# Patient Record
Sex: Female | Born: 1970 | Race: Black or African American | Hispanic: No | Marital: Married | Smoking: Never smoker
Health system: Southern US, Community
[De-identification: ages and names within clinical notes are randomized; demographics above are authoritative.]

## PROBLEM LIST (undated history)

## (undated) ENCOUNTER — Other Ambulatory Visit: Payer: Self-pay | Admitting: General Surgery

## (undated) DIAGNOSIS — J45909 Unspecified asthma, uncomplicated: Secondary | ICD-10-CM

## (undated) DIAGNOSIS — C50919 Malignant neoplasm of unspecified site of unspecified female breast: Secondary | ICD-10-CM

## (undated) DIAGNOSIS — J302 Other seasonal allergic rhinitis: Secondary | ICD-10-CM

## (undated) HISTORY — DX: Malignant neoplasm of unspecified site of unspecified female breast: C50.919

## (undated) HISTORY — DX: Other seasonal allergic rhinitis: J30.2

---

## 2002-10-11 ENCOUNTER — Ambulatory Visit (HOSPITAL_COMMUNITY): Admission: RE | Admit: 2002-10-11 | Discharge: 2002-10-11 | Payer: Self-pay | Admitting: Obstetrics & Gynecology

## 2002-10-11 ENCOUNTER — Encounter: Payer: Self-pay | Admitting: Obstetrics & Gynecology

## 2003-01-11 ENCOUNTER — Encounter: Payer: Self-pay | Admitting: Obstetrics & Gynecology

## 2003-01-11 ENCOUNTER — Ambulatory Visit (HOSPITAL_COMMUNITY): Admission: RE | Admit: 2003-01-11 | Discharge: 2003-01-11 | Payer: Self-pay | Admitting: Obstetrics & Gynecology

## 2003-03-20 ENCOUNTER — Encounter: Payer: Self-pay | Admitting: Obstetrics & Gynecology

## 2003-03-20 ENCOUNTER — Ambulatory Visit (HOSPITAL_COMMUNITY): Admission: RE | Admit: 2003-03-20 | Discharge: 2003-03-20 | Payer: Self-pay | Admitting: Obstetrics & Gynecology

## 2003-11-06 ENCOUNTER — Inpatient Hospital Stay (HOSPITAL_COMMUNITY): Admission: AD | Admit: 2003-11-06 | Discharge: 2003-11-10 | Payer: Self-pay | Admitting: Obstetrics

## 2005-02-05 ENCOUNTER — Ambulatory Visit (HOSPITAL_COMMUNITY): Admission: RE | Admit: 2005-02-05 | Discharge: 2005-02-05 | Payer: Self-pay | Admitting: Obstetrics & Gynecology

## 2006-02-27 ENCOUNTER — Ambulatory Visit (HOSPITAL_COMMUNITY): Admission: RE | Admit: 2006-02-27 | Discharge: 2006-02-27 | Payer: Self-pay | Admitting: Obstetrics & Gynecology

## 2006-11-06 ENCOUNTER — Ambulatory Visit (HOSPITAL_COMMUNITY): Admission: RE | Admit: 2006-11-06 | Discharge: 2006-11-06 | Payer: Self-pay | Admitting: Obstetrics & Gynecology

## 2006-11-23 ENCOUNTER — Ambulatory Visit (HOSPITAL_COMMUNITY): Admission: RE | Admit: 2006-11-23 | Discharge: 2006-11-23 | Payer: Self-pay | Admitting: Obstetrics & Gynecology

## 2006-12-02 ENCOUNTER — Ambulatory Visit (HOSPITAL_COMMUNITY): Admission: RE | Admit: 2006-12-02 | Discharge: 2006-12-02 | Payer: Self-pay | Admitting: Obstetrics & Gynecology

## 2006-12-04 ENCOUNTER — Ambulatory Visit (HOSPITAL_COMMUNITY): Admission: RE | Admit: 2006-12-04 | Discharge: 2006-12-04 | Payer: Self-pay | Admitting: Obstetrics & Gynecology

## 2010-09-22 ENCOUNTER — Encounter: Payer: Self-pay | Admitting: Internal Medicine

## 2010-09-22 ENCOUNTER — Encounter: Payer: Self-pay | Admitting: Obstetrics & Gynecology

## 2010-10-17 ENCOUNTER — Other Ambulatory Visit: Payer: Self-pay | Admitting: Obstetrics & Gynecology

## 2010-10-17 DIAGNOSIS — N979 Female infertility, unspecified: Secondary | ICD-10-CM

## 2010-10-24 ENCOUNTER — Ambulatory Visit (HOSPITAL_COMMUNITY)
Admission: RE | Admit: 2010-10-24 | Discharge: 2010-10-24 | Disposition: A | Discharge status: Home / Self Care | Payer: BC Managed Care – PPO | Source: Ambulatory Visit | Attending: Obstetrics & Gynecology | Admitting: Obstetrics & Gynecology

## 2010-10-24 ENCOUNTER — Other Ambulatory Visit: Payer: Self-pay | Admitting: Obstetrics & Gynecology

## 2010-10-24 DIAGNOSIS — N838 Other noninflammatory disorders of ovary, fallopian tube and broad ligament: Secondary | ICD-10-CM | POA: Insufficient documentation

## 2010-10-24 DIAGNOSIS — N979 Female infertility, unspecified: Secondary | ICD-10-CM | POA: Insufficient documentation

## 2010-10-24 DIAGNOSIS — Z319 Encounter for procreative management, unspecified: Secondary | ICD-10-CM

## 2010-10-24 DIAGNOSIS — D251 Intramural leiomyoma of uterus: Secondary | ICD-10-CM | POA: Insufficient documentation

## 2011-12-22 ENCOUNTER — Other Ambulatory Visit: Payer: Self-pay | Admitting: Obstetrics & Gynecology

## 2011-12-22 DIAGNOSIS — R102 Pelvic and perineal pain: Secondary | ICD-10-CM

## 2011-12-22 DIAGNOSIS — D259 Leiomyoma of uterus, unspecified: Secondary | ICD-10-CM

## 2011-12-26 ENCOUNTER — Ambulatory Visit (HOSPITAL_COMMUNITY)
Admission: RE | Admit: 2011-12-26 | Discharge: 2011-12-26 | Disposition: A | Discharge status: Home / Self Care | Payer: BC Managed Care – PPO | Source: Ambulatory Visit | Attending: Obstetrics & Gynecology | Admitting: Obstetrics & Gynecology

## 2011-12-26 ENCOUNTER — Ambulatory Visit (HOSPITAL_COMMUNITY): Payer: BC Managed Care – PPO

## 2011-12-26 DIAGNOSIS — D250 Submucous leiomyoma of uterus: Secondary | ICD-10-CM | POA: Insufficient documentation

## 2011-12-26 DIAGNOSIS — N949 Unspecified condition associated with female genital organs and menstrual cycle: Secondary | ICD-10-CM | POA: Insufficient documentation

## 2011-12-26 DIAGNOSIS — N938 Other specified abnormal uterine and vaginal bleeding: Secondary | ICD-10-CM | POA: Insufficient documentation

## 2011-12-26 DIAGNOSIS — R102 Pelvic and perineal pain: Secondary | ICD-10-CM

## 2011-12-26 DIAGNOSIS — N83209 Unspecified ovarian cyst, unspecified side: Secondary | ICD-10-CM | POA: Insufficient documentation

## 2011-12-26 DIAGNOSIS — D259 Leiomyoma of uterus, unspecified: Secondary | ICD-10-CM

## 2012-02-24 IMAGING — US US SONOHYSTEROGRAM - WITH RAD
2 series · 13 of 25 positions shown · non-contrast
Comparison: None.
COMPARISON: None.

CLINICAL DATA: Infertility.

TRANSVAGINAL ULTRASOUND OF PELVIS
TECHNIQUE: Transvaginal ultrasound examination of the pelvis was
performed including evaluation of the uterus, ovaries, adnexal
regions, and pelvic cul-de-sac.
TECHNIQUE: Following cleansing of the cervix and vagina with
Betadine, a hysterosalpingogram catheter was placed within the
endocervical canal.  Sonohysterogram was then performed with
transvaginal sonography during infusion of sterile saline solution
into the endometrial cavity.

[Series 1: us sonohysterogram · 7 of 40 slices shown (1 of 2)]
[im 1/40]
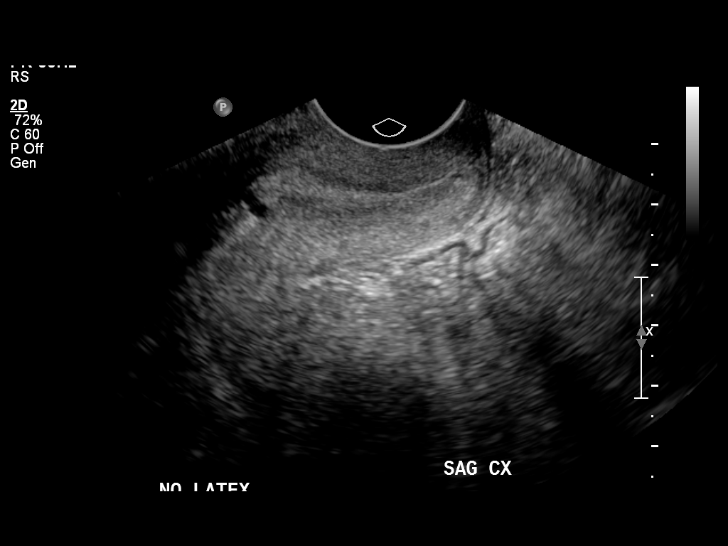
[im 7/40]
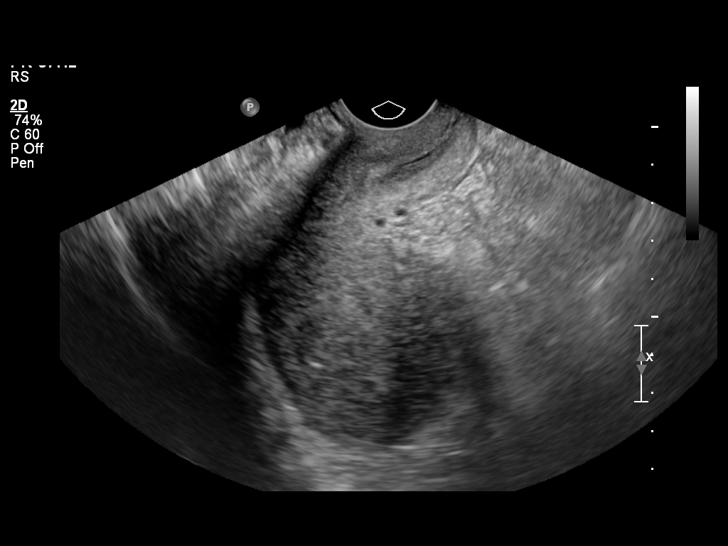
[im 14/40]
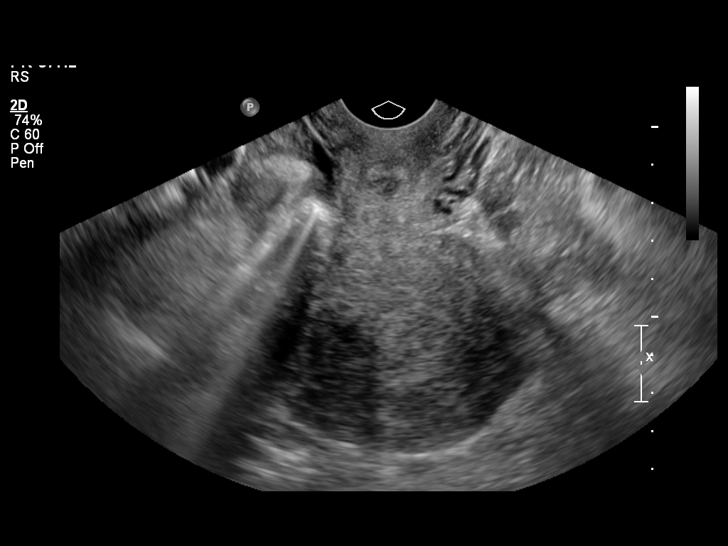
[im 20/40]
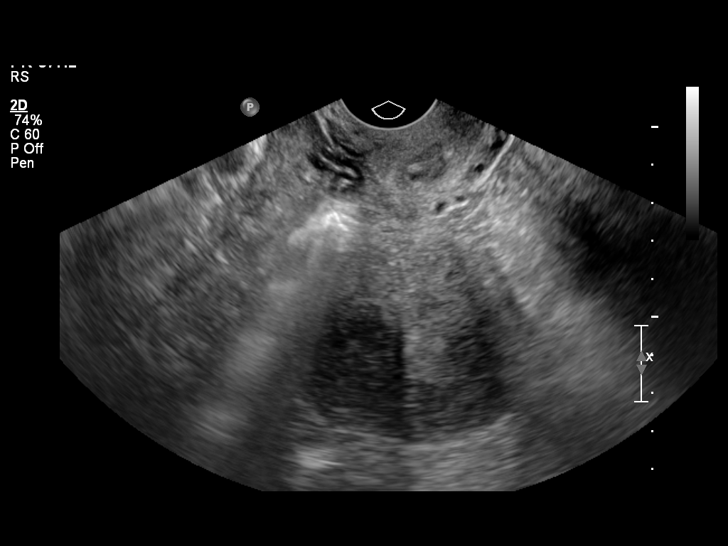
[im 27/40]
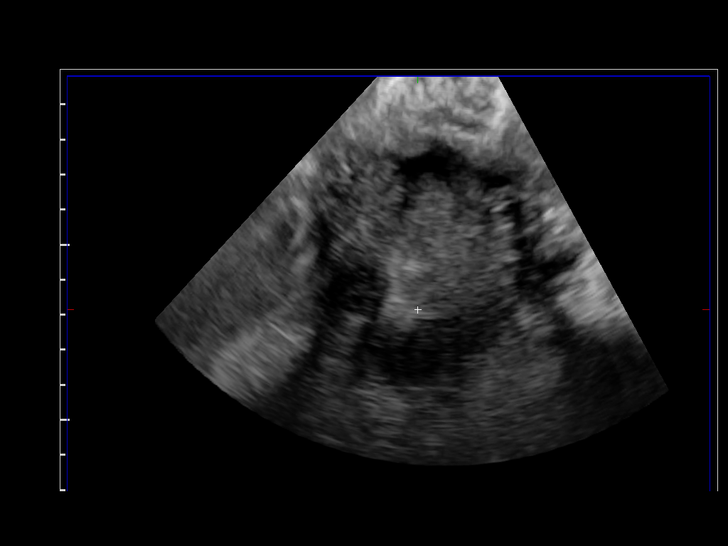
[im 33/40]
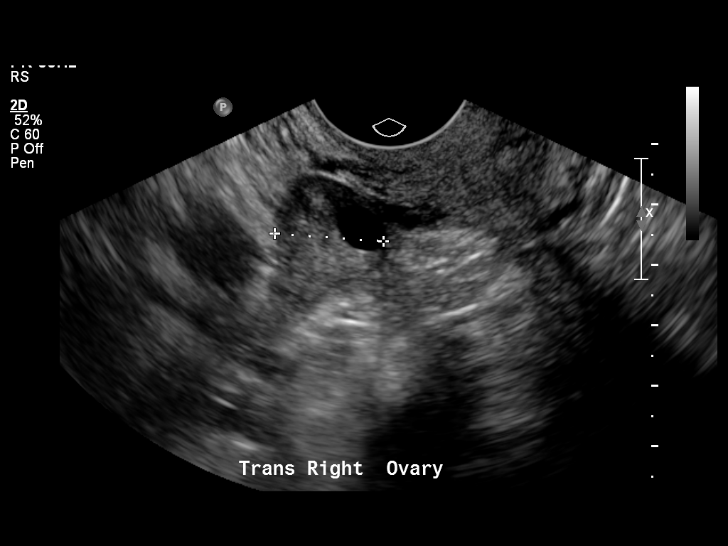
[im 40/40]
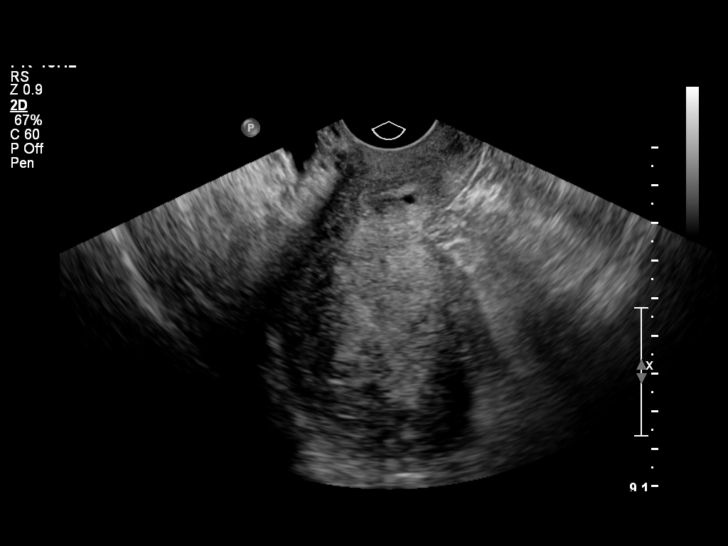

[Series 1: us sonohysterogram · 6 of 35 slices shown (2 of 2)]
[im 4/35]
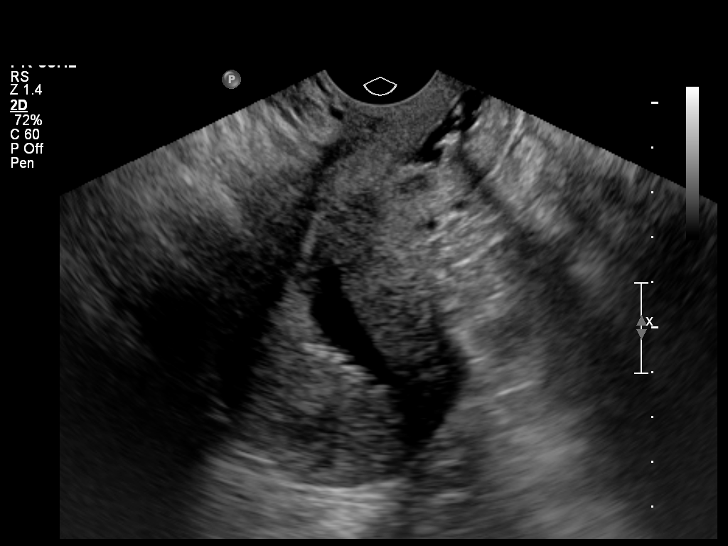
[im 10/35]
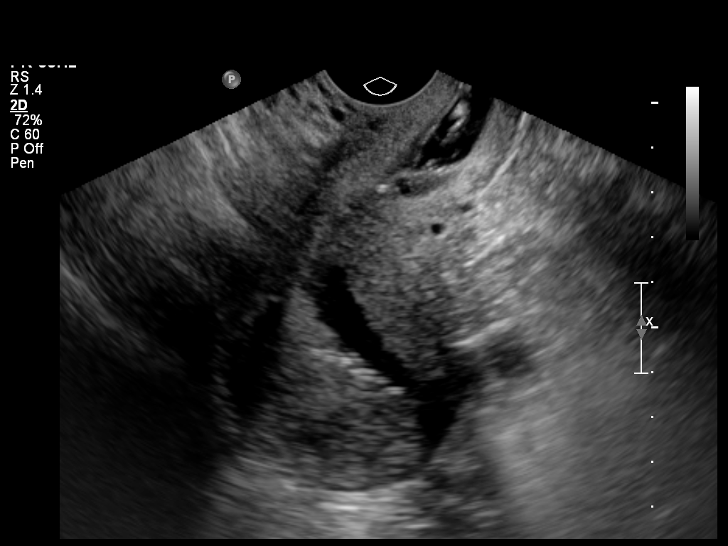
[im 16/35]
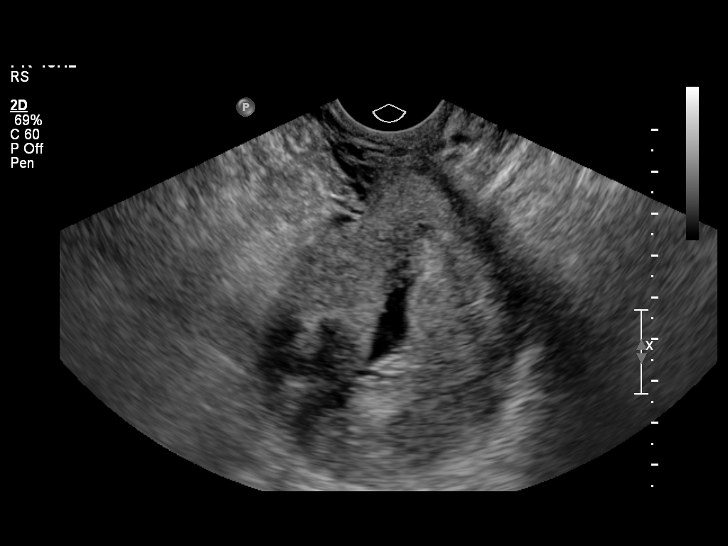
[im 22/35]
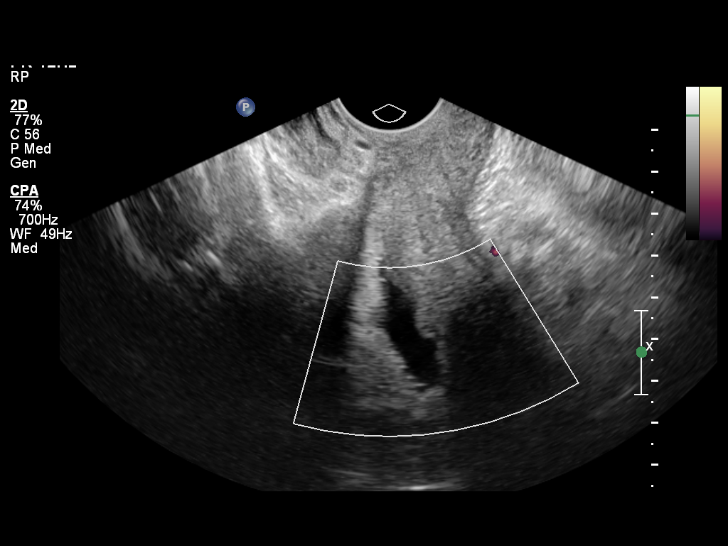
[im 28/35]
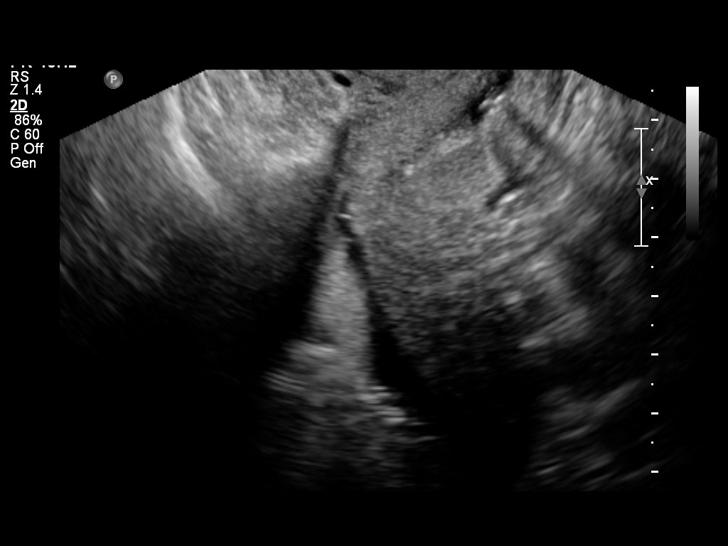
[im 35/35]
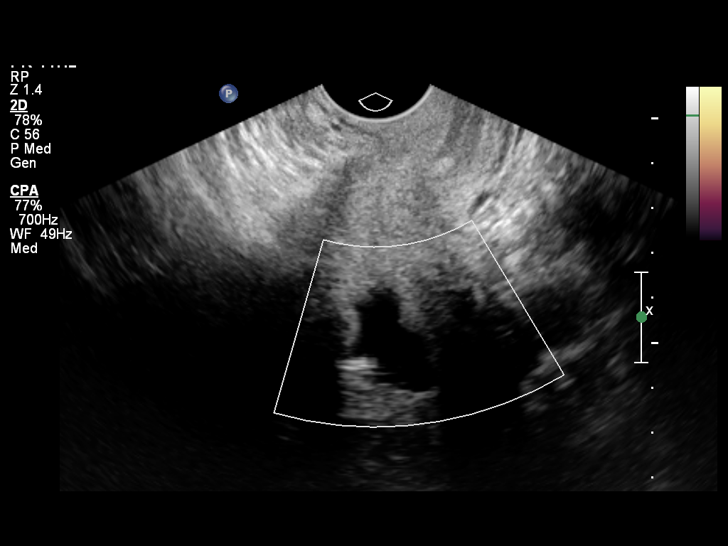

[13 of 25 positions shown; findings below may reference images not displayed]

FINDINGS: Uterus is anteverted and retroflexed and measures approximately
10.3 by 5.0 x 5.7 cm.  A single intramural fibroid is seen in the
right uterine fundus which measures 1.9 cm in maximum diameter.  No
other fibroids identified.

Endometrium is somewhat difficult to visualize due to uterine lie,
but measures approximately 8 mm.

Right Ovary measures 2.9 x 1.6 x 1.8 cm.  The right ovary contains
approximately five small follicles, with the largest measuring 11
mm in diameter.

Left Ovary measures 2.8 x 2.0 by 2.0 cm. The left ovary contains a
between five and 10 small follicles, largest measuring 11 mm in
diameter.

Other Findings:  No ovarian or adnexal masses are identified.  No
evidence of free fluid.
IMPRESSION: 1.  Single 1.9 cm intramural fibroid in the right uterine fundus.
Endometrial thickness measures 8 mm.
2.  Approximately 5-10 follicles in each ovary, measuring up to 11
mm in diameter.

*US SONOHYSTEROGRAM
FINDINGS: Evaluation of the endometrium uterus was technically
difficult due to uterine lie.  A single endometrial cavity is seen,
without evidence of myometrial septum.

No focal polyps or masses are identified within the endometrial
cavity.  Double layer endometrial thickness measures 8 mm.  No
submucosal fibroids are seen involving the endometrial cavity.  A
small intramural fibroid again noted in the right uterine fundus.
IMPRESSION: No evidence of focal endometrial pathology or submucosal fibroids.

## 2012-02-26 ENCOUNTER — Other Ambulatory Visit: Payer: Self-pay | Admitting: Obstetrics & Gynecology

## 2012-02-26 DIAGNOSIS — N83209 Unspecified ovarian cyst, unspecified side: Secondary | ICD-10-CM

## 2012-03-25 ENCOUNTER — Ambulatory Visit (HOSPITAL_COMMUNITY): Payer: BC Managed Care – PPO

## 2012-03-30 ENCOUNTER — Ambulatory Visit (HOSPITAL_COMMUNITY)
Admission: RE | Admit: 2012-03-30 | Discharge: 2012-03-30 | Disposition: A | Discharge status: Home / Self Care | Payer: BC Managed Care – PPO | Source: Ambulatory Visit | Attending: Obstetrics & Gynecology | Admitting: Obstetrics & Gynecology

## 2012-03-30 DIAGNOSIS — N83209 Unspecified ovarian cyst, unspecified side: Secondary | ICD-10-CM | POA: Insufficient documentation

## 2012-03-30 DIAGNOSIS — D259 Leiomyoma of uterus, unspecified: Secondary | ICD-10-CM | POA: Insufficient documentation

## 2012-11-25 ENCOUNTER — Ambulatory Visit: Payer: Self-pay | Admitting: Obstetrics & Gynecology

## 2012-11-25 ENCOUNTER — Ambulatory Visit: Payer: BC Managed Care – PPO | Admitting: Obstetrics & Gynecology

## 2013-04-08 ENCOUNTER — Other Ambulatory Visit: Payer: Self-pay | Admitting: Obstetrics & Gynecology

## 2013-04-08 ENCOUNTER — Ambulatory Visit (HOSPITAL_COMMUNITY)
Admission: RE | Admit: 2013-04-08 | Discharge: 2013-04-08 | Disposition: A | Discharge status: Home / Self Care | Payer: BC Managed Care – PPO | Source: Ambulatory Visit | Attending: Obstetrics & Gynecology | Admitting: Obstetrics & Gynecology

## 2013-04-08 DIAGNOSIS — D251 Intramural leiomyoma of uterus: Secondary | ICD-10-CM | POA: Insufficient documentation

## 2013-04-08 DIAGNOSIS — D250 Submucous leiomyoma of uterus: Secondary | ICD-10-CM | POA: Insufficient documentation

## 2013-04-08 DIAGNOSIS — N854 Malposition of uterus: Secondary | ICD-10-CM | POA: Insufficient documentation

## 2013-04-08 DIAGNOSIS — N83209 Unspecified ovarian cyst, unspecified side: Secondary | ICD-10-CM

## 2013-04-08 DIAGNOSIS — R1031 Right lower quadrant pain: Secondary | ICD-10-CM | POA: Insufficient documentation

## 2013-04-11 ENCOUNTER — Encounter: Payer: Self-pay | Admitting: Obstetrics & Gynecology

## 2013-04-11 ENCOUNTER — Ambulatory Visit (INDEPENDENT_AMBULATORY_CARE_PROVIDER_SITE_OTHER): Payer: BC Managed Care – PPO | Admitting: Obstetrics & Gynecology

## 2013-04-11 ENCOUNTER — Ambulatory Visit: Payer: BC Managed Care – PPO | Admitting: Obstetrics & Gynecology

## 2013-04-11 ENCOUNTER — Ambulatory Visit (HOSPITAL_COMMUNITY): Payer: BC Managed Care – PPO

## 2013-04-11 VITALS — BP 107/70 | HR 80 | Temp 98.9°F | Wt 166.0 lb

## 2013-04-11 DIAGNOSIS — N83209 Unspecified ovarian cyst, unspecified side: Secondary | ICD-10-CM

## 2013-04-11 NOTE — Progress Notes (Signed)
Subjective:     Sheila [name] is a 42 y.o. female here for a follow up for u/s results.  Current complaints: abdominal pain.  Personal health questionnaire reviewed: not asked.   Gynecologic History Patient's last menstrual period was 03/20/2013.     The following portions of the patient's history were reviewed and updated as appropriate: allergies, current medications, past family history, past medical history, past social history, past surgical history and problem list.  Review of Systems Pertinent items are noted in HPI.    Objective:     No exam today     Assessment:    Likely functional ovarian cyst  Plan:     Repeat U/S in a few months

## 2013-04-11 NOTE — Patient Instructions (Signed)
Ovarian Cyst  The ovaries are small organs that are on each side of the uterus. The ovaries are the organs that produce the female hormones, estrogen and progesterone. An ovarian cyst is a sac filled with fluid that can vary in its size. It is normal for a small cyst to form in women who are in the childbearing age and who have menstrual periods. This type of cyst is called a follicle cyst that becomes an ovulation cyst (corpus luteum cyst) after it produces the women's egg. It later goes away on its own if the woman does not become pregnant. There are other kinds of ovarian cysts that may cause problems and may need to be treated. The most serious problem is a cyst with cancer. It should be noted that menopausal women who have an ovarian cyst are at a higher risk of it being a cancer cyst. They should be evaluated very quickly, thoroughly and followed closely. This is especially true in menopausal women because of the high rate of ovarian cancer in women in menopause.  CAUSES AND TYPES OF OVARIAN CYSTS:   FUNCTIONAL CYST: The follicle/corpus luteum cyst is a functional cyst that occurs every month during ovulation with the menstrual cycle. They go away with the next menstrual cycle if the woman does not get pregnant. Usually, there are no symptoms with a functional cyst.   ENDOMETRIOMA CYST: This cyst develops from the lining of the uterus tissue. This cyst gets in or on the ovary. It grows every month from the bleeding during the menstrual period. It is also called a "chocolate cyst" because it becomes filled with blood that turns brown. This cyst can cause pain in the lower abdomen during intercourse and with your menstrual period.   CYSTADENOMA CYST: This cyst develops from the cells on the outside of the ovary. They usually are not cancerous. They can get very big and cause lower abdomen pain and pain with intercourse. This type of cyst can twist on itself, cut off its blood supply and cause severe pain. It  also can easily rupture and cause a lot of pain.   DERMOID CYST: This type of cyst is sometimes found in both ovaries. They are found to have different kinds of body tissue in the cyst. The tissue includes skin, teeth, hair, and/or cartilage. They usually do not have symptoms unless they get very big. Dermoid cysts are rarely cancerous.   POLYCYSTIC OVARY: This is a rare condition with hormone problems that produces many small cysts on both ovaries. The cysts are follicle-like cysts that never produce an egg and become a corpus luteum. It can cause an increase in body weight, infertility, acne, increase in body and facial hair and lack of menstrual periods or rare menstrual periods. Many women with this problem develop type 2 diabetes. The exact cause of this problem is unknown. A polycystic ovary is rarely cancerous.   THECA LUTEIN CYST: Occurs when too much hormone (human chorionic gonadotropin) is produced and over-stimulates the ovaries to produce an egg. They are frequently seen when doctors stimulate the ovaries for invitro-fertilization (test tube babies).   LUTEOMA CYST: This cyst is seen during pregnancy. Rarely it can cause an obstruction to the birth canal during labor and delivery. They usually go away after delivery.  SYMPTOMS    Pelvic pain or pressure.   Pain during sexual intercourse.   Increasing girth (swelling) of the abdomen.   Abnormal menstrual periods.   Increasing pain with menstrual periods.     You stop having menstrual periods and you are not pregnant.  DIAGNOSIS   The diagnosis can be made during:   Routine or annual pelvic examination (common).   Ultrasound.   X-ray of the pelvis.   CT Scan.   MRI.   Blood tests.  TREATMENT    Treatment may only be to follow the cyst monthly for 2 to 3 months with your caregiver. Many go away on their own, especially functional cysts.   May be aspirated (drained) with a long needle with ultrasound, or by laparoscopy (inserting a tube into  the pelvis through a small incision).   The whole cyst can be removed by laparoscopy.   Sometimes the cyst may need to be removed through an incision in the lower abdomen.   Hormone treatment is sometimes used to help dissolve certain cysts.   Birth control pills are sometimes used to help dissolve certain cysts.  HOME CARE INSTRUCTIONS   Follow your caregiver's advice regarding:   Medicine.   Follow up visits to evaluate and treat the cyst.   You may need to come back or make an appointment with another caregiver, to find the exact cause of your cyst, if your caregiver is not a gynecologist.   Get your yearly and recommended pelvic examinations and Pap tests.   Let your caregiver know if you have had an ovarian cyst in the past.  SEEK MEDICAL CARE IF:    Your periods are late, irregular, they stop, or are painful.   Your stomach (abdomen) or pelvic pain does not go away.   Your stomach becomes larger or swollen.   You have pressure on your bladder or trouble emptying your bladder completely.   You have painful sexual intercourse.   You have feelings of fullness, pressure, or discomfort in your stomach.   You lose weight for no apparent reason.   You feel generally ill.   You become constipated.   You lose your appetite.   You develop acne.   You have an increase in body and facial hair.   You are gaining weight, without changing your exercise and eating habits.   You think you are pregnant.  SEEK IMMEDIATE MEDICAL CARE IF:    You have increasing abdominal pain.   You feel sick to your stomach (nausea) and/or vomit.   You develop a fever that comes on suddenly.   You develop abdominal pain during a bowel movement.   Your menstrual periods become heavier than usual.  Document Released: 08/18/2005 Document Revised: 11/10/2011 Document Reviewed: 06/21/2009  [employer] Patient Information 2014 [employer]

## 2013-04-27 IMAGING — US US TRANSVAGINAL NON-OB
1 series · 13 of 25 positions shown · non-contrast
Comparison: 10/24/2010

CLINICAL DATA: Functional uterine bleeding with pelvic pain.  LMP
12/01/2011

TRANSABDOMINAL AND TRANSVAGINAL ULTRASOUND OF PELVIS
TECHNIQUE: Both transabdominal and transvaginal ultrasound
examinations of the pelvis were performed. Transabdominal technique
was performed for global imaging of the pelvis including uterus,
ovaries, adnexal regions, and pelvic cul-de-sac.

[Series 1: us pelvis complete · 13 of 61 slices shown]
[im 1/61]
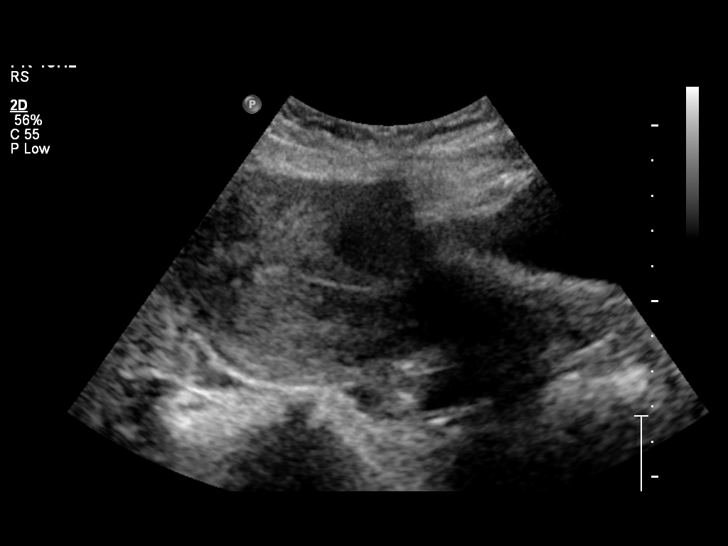
[im 6/61]
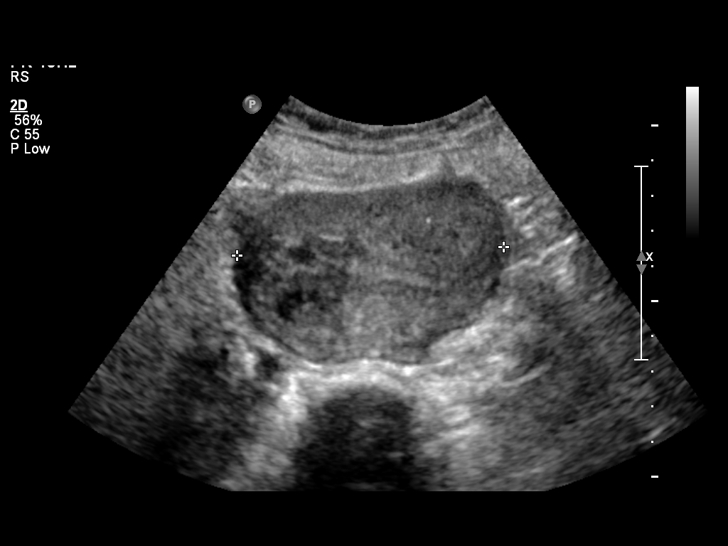
[im 11/61]
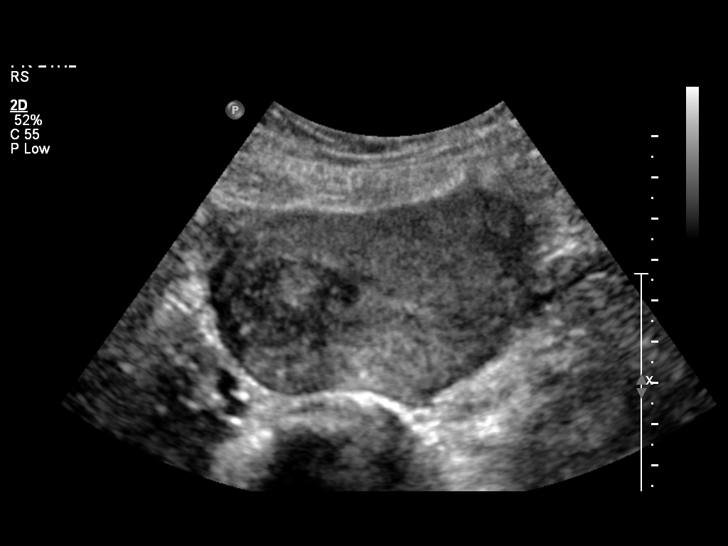
[im 16/61]
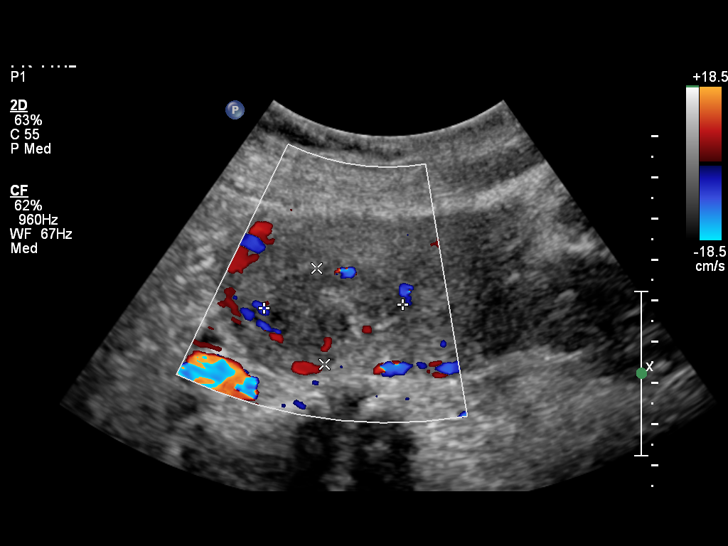
[im 21/61]
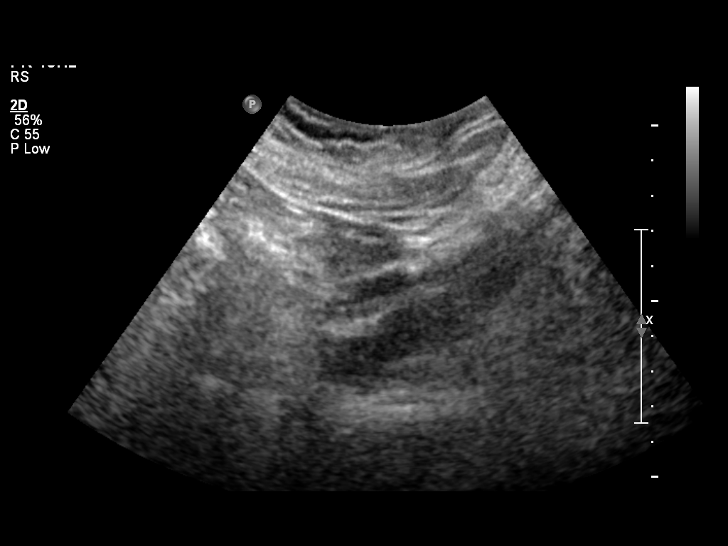
[im 26/61]
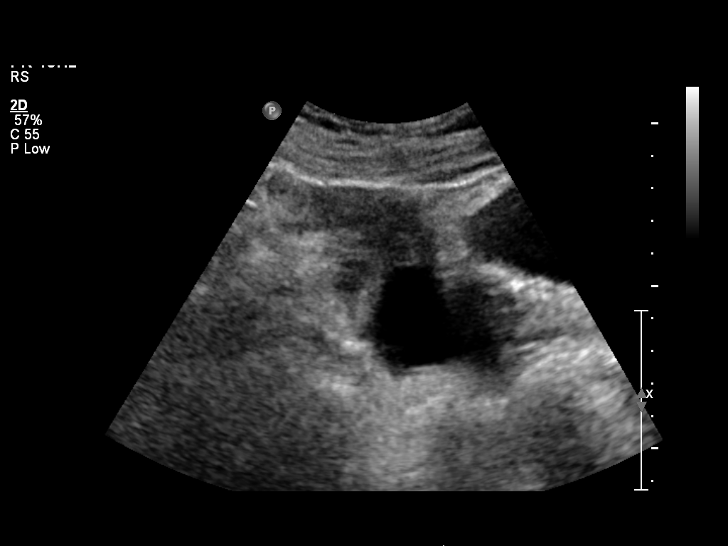
[im 31/61]
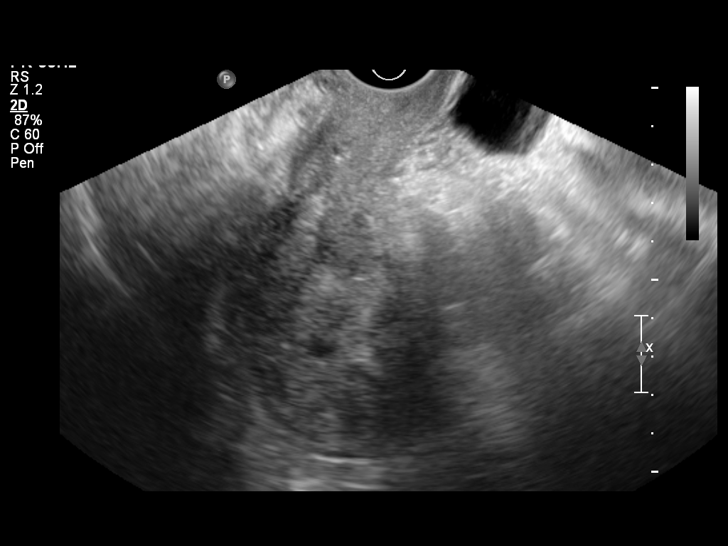
[im 36/61]
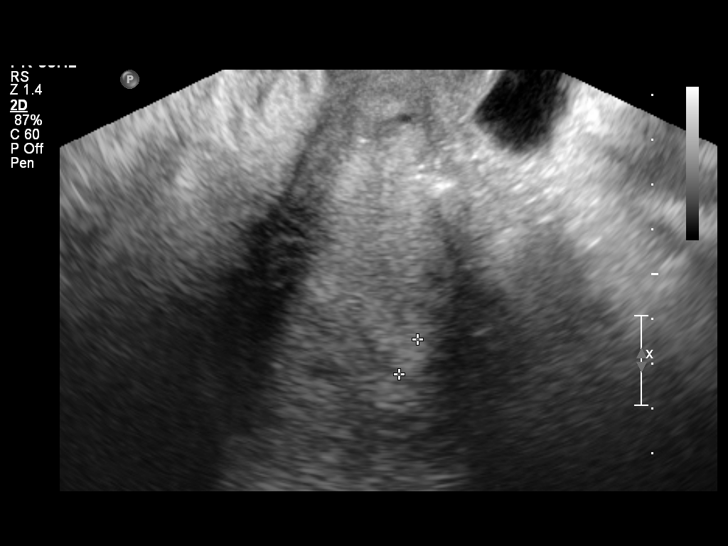
[im 41/61]
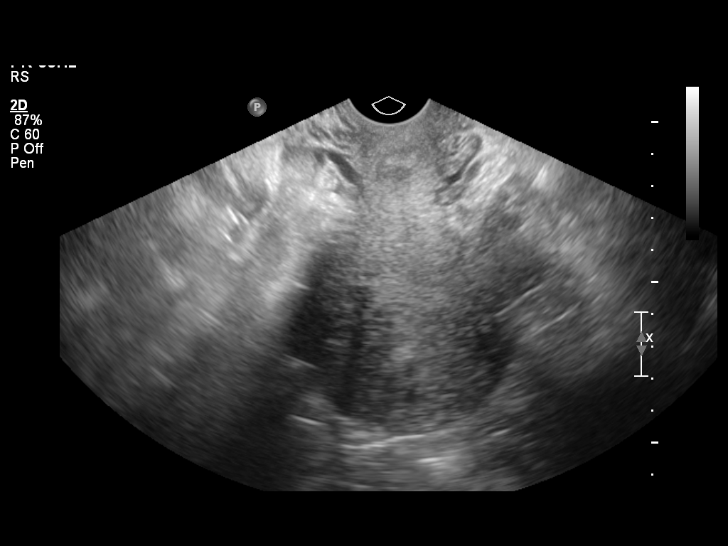
[im 46/61]
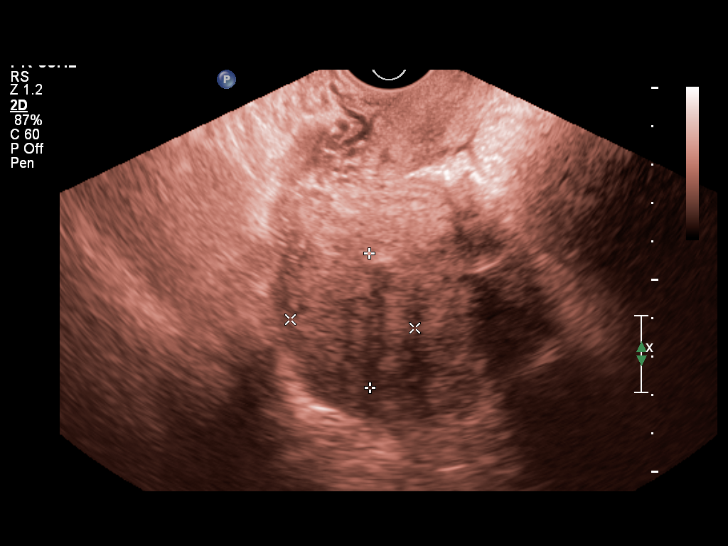
[im 51/61]
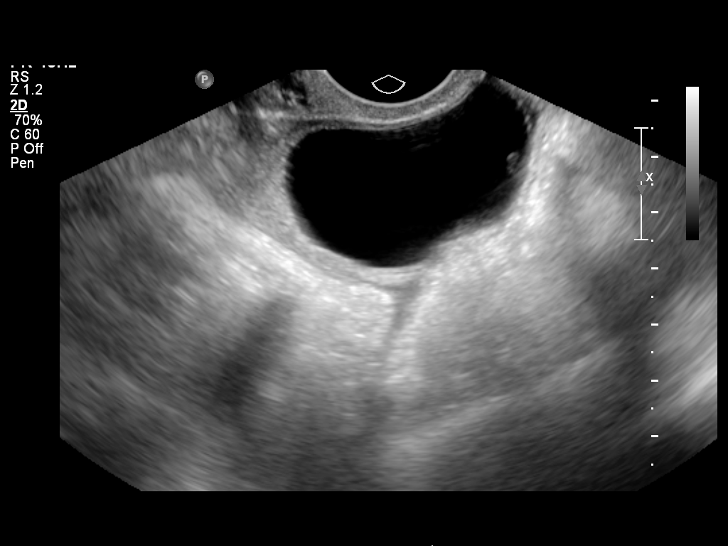
[im 56/61]
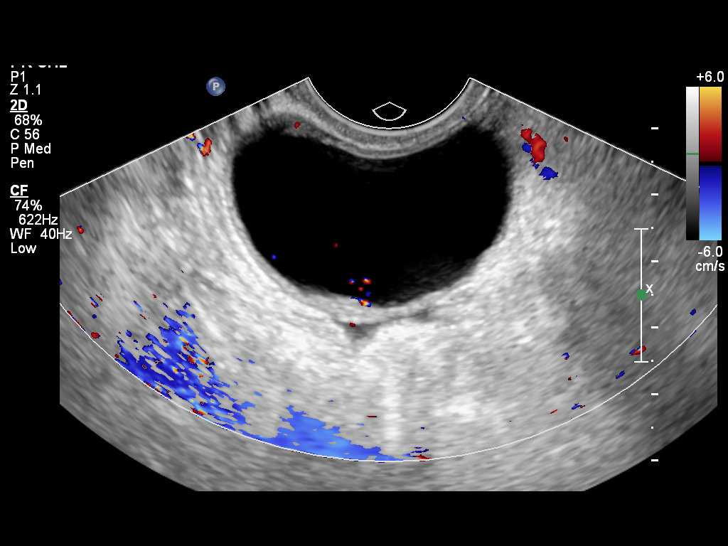
[im 61/61]
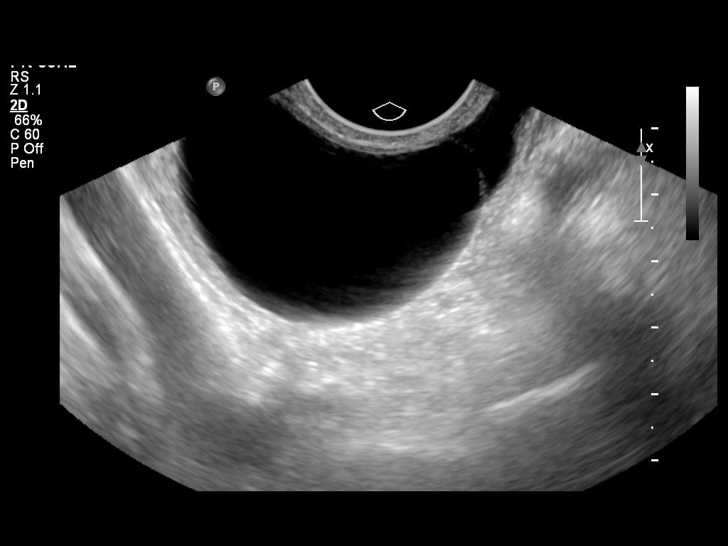

[13 of 25 positions shown; findings below may reference images not displayed]

It was necessary to proceed with endovaginal exam following the
transabdominal exam to visualize the myometrium, endometrium and
adnexa.
FINDINGS: Uterus: The uterus is anteverted and retroflexed and demonstrates a
sagittal length of 11.5 cm, depth of 5.6 cm and a transverse width
of 7.5 cm.  A focal fibroid is again identified in the right
lateral mid body measuring 3.0 x 2.9 x 2.0 cm.  This is best seen
transabdominally due to uterine position on endovaginal exam.
Deviation of the endometrial canal by the small fibroid suggests a
partial submucosal component.

Endometrium: Is homogeneously echogenic with an AP width of 9 mm.
No focal endometrial abnormality is identified

Right ovary:  Measures 5.9 x 3.0 x 4.6 cm and contains a large thin-
walled cyst with solitary thin avascular septation.  No abnormal
circumferential flow is seen.

Left ovary: Has a normal appearance measuring 2.4 x 1.3 x 1.3 cm
and is well seen only transabdominally

Other findings: A small amount of simple free fluid is identified
adjacent to the right ovary.
IMPRESSION: Small focal fibroid with sizes location as noted above.  A small
submucosal component is suspected.

New thin walled right ovarian cyst with a solitary thin septation.
No overtly worrisome features are present and this likely
represents a benign functional or benign neoplastic process.
Follow-up is recommended in the postsecretory phase of the cycle
following the next complete cycle to assess for interval resolution
as might be expected at this represents a functional process.

## 2013-06-16 ENCOUNTER — Ambulatory Visit: Payer: BC Managed Care – PPO | Admitting: Obstetrics & Gynecology

## 2013-07-31 IMAGING — US US TRANSVAGINAL NON-OB
1 series · 14 of 25 positions shown · non-contrast
Comparison: December 26, 2011

CLINICAL DATA: Ovarian cyst, fibroids



[Series 1: us pelvis complete · 14 of 28 slices shown]
[im 1/28]
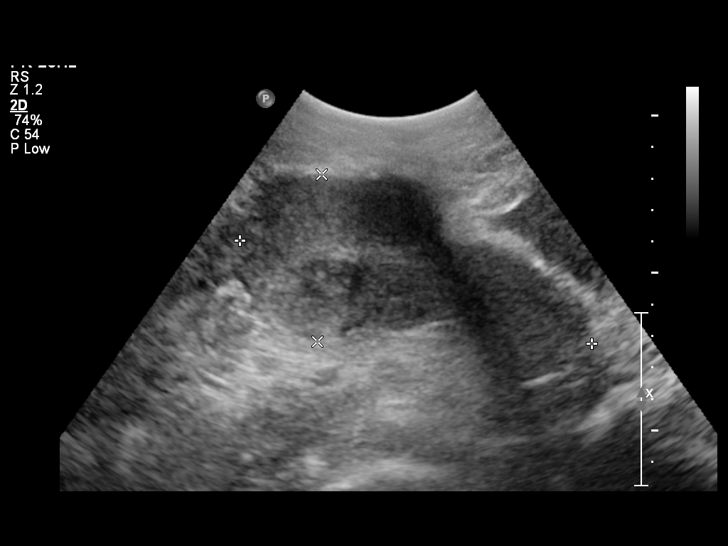
[im 3/28]
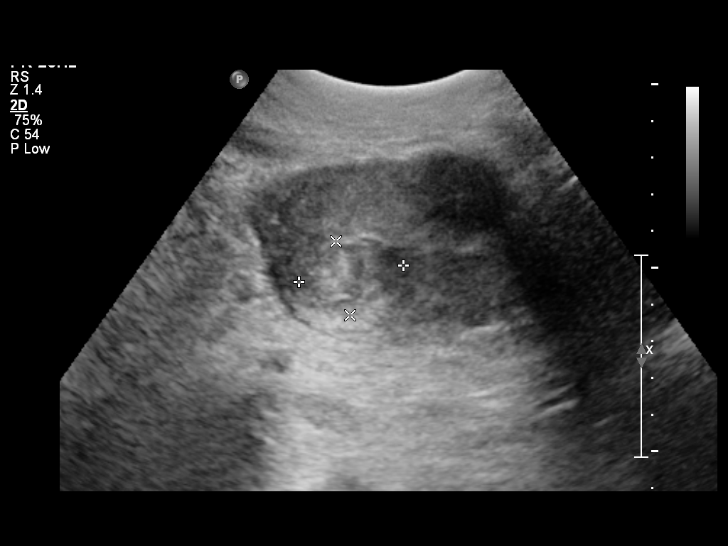
[im 5/28]
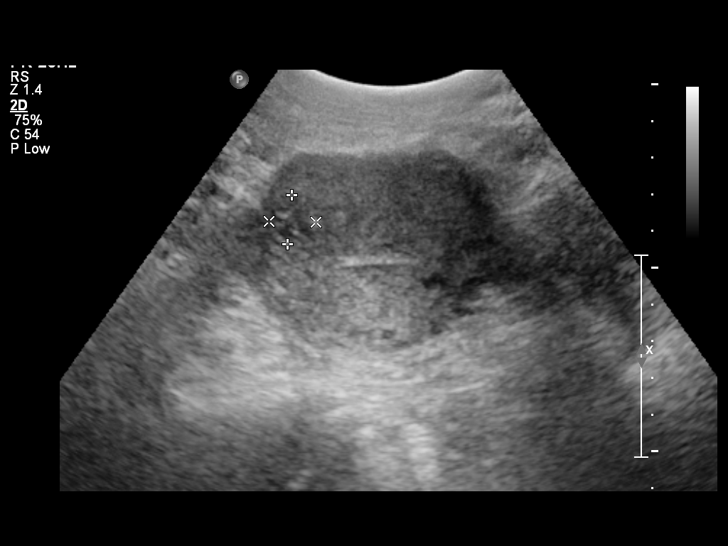
[im 7/28]
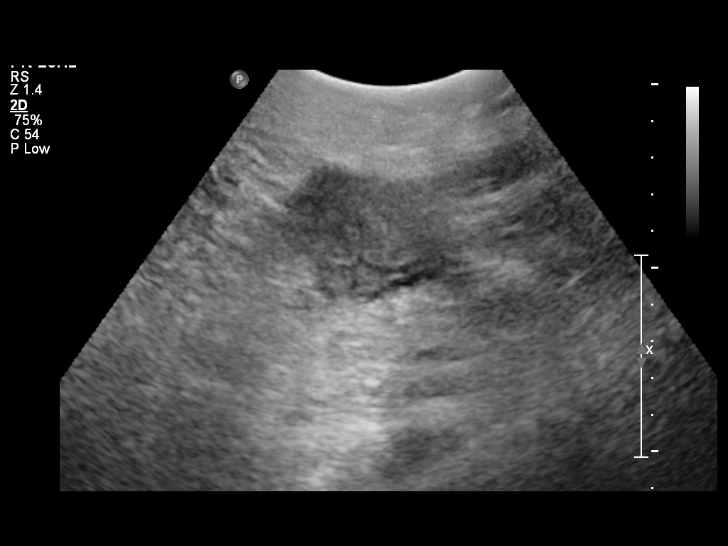
[im 10/28]
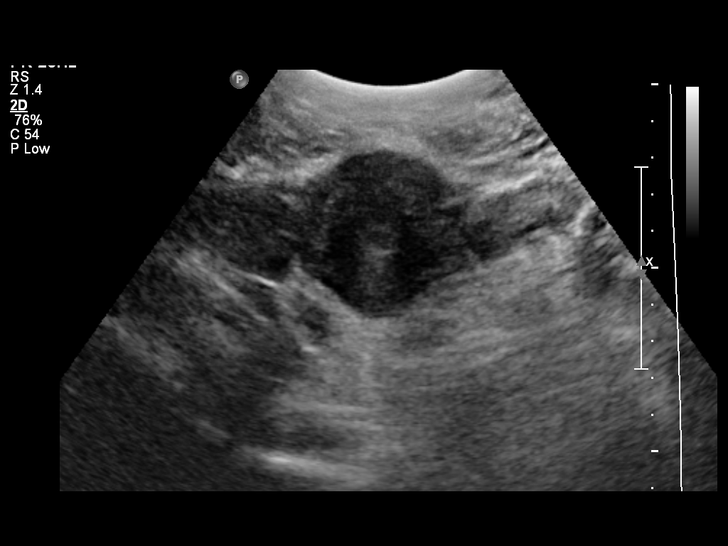
[im 11/28]
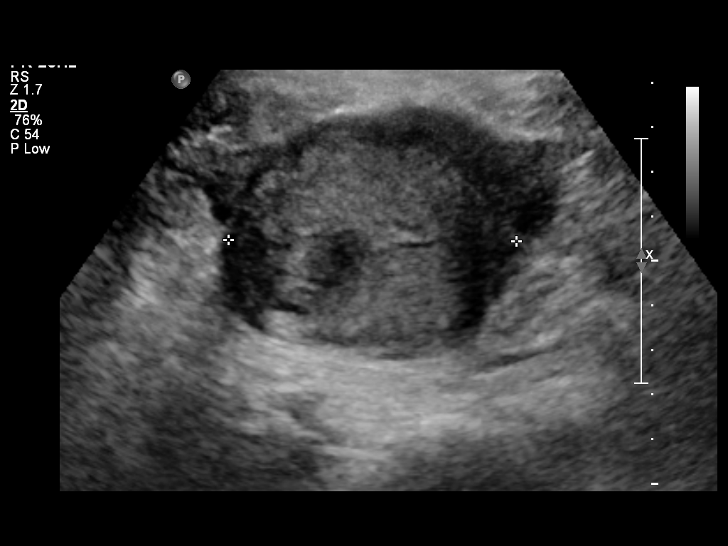
[im 13/28]
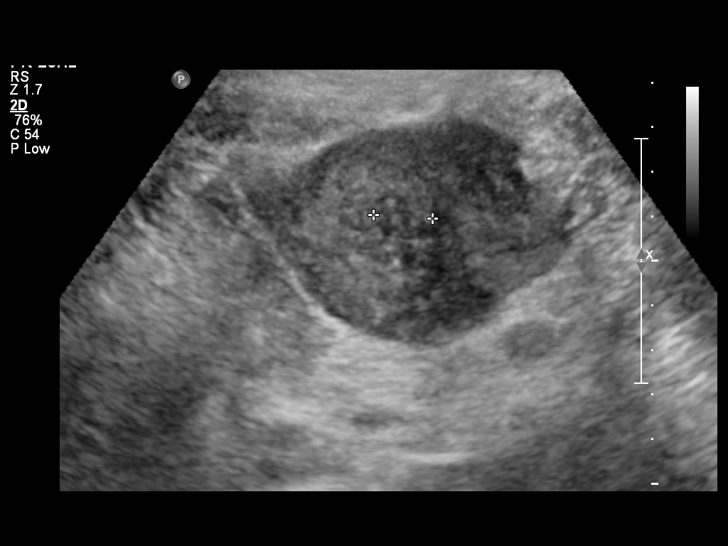
[im 15/28]
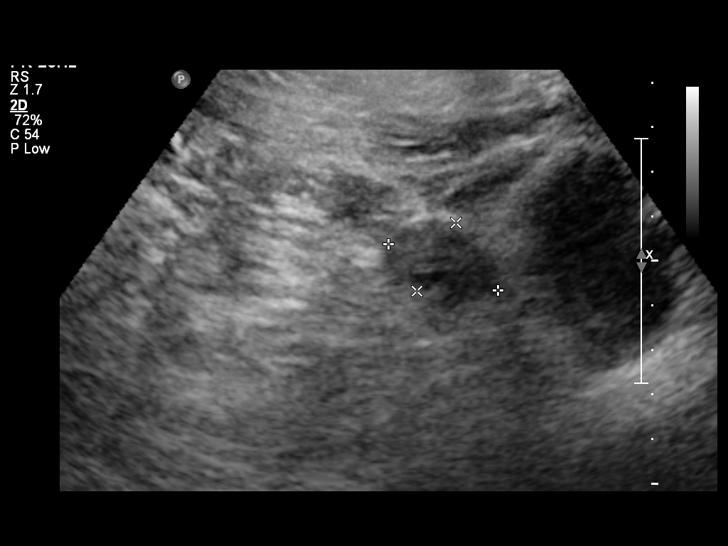
[im 17/28]
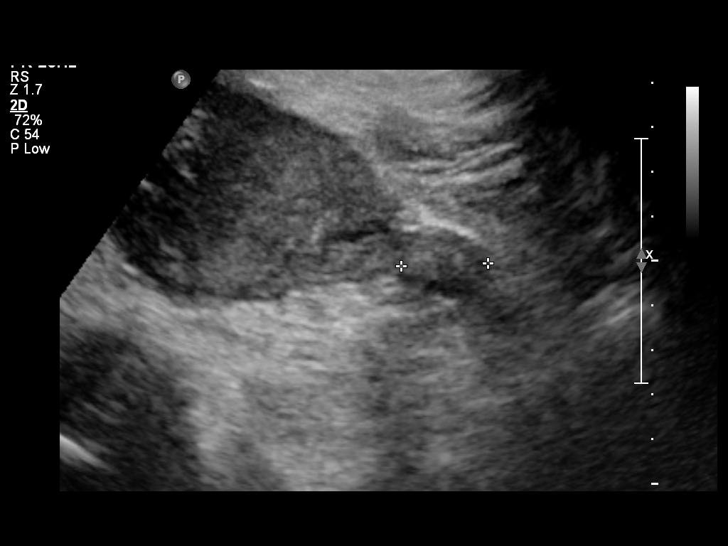
[im 19/28]
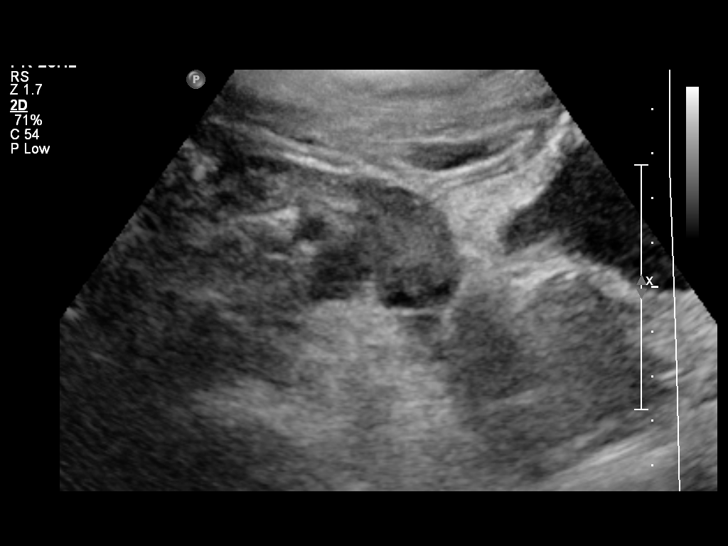
[im 21/28]
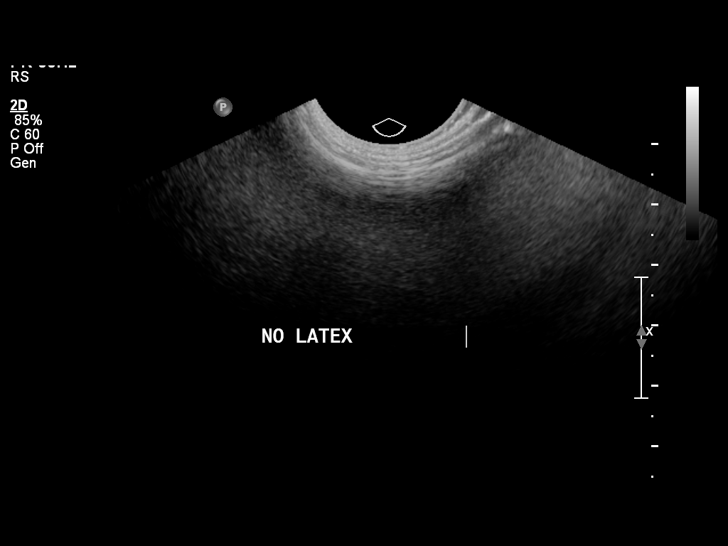
[im 23/28]
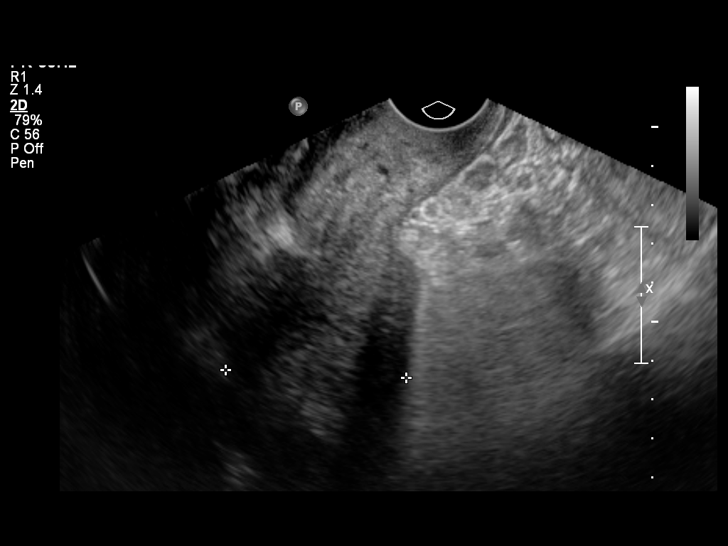
[im 25/28]
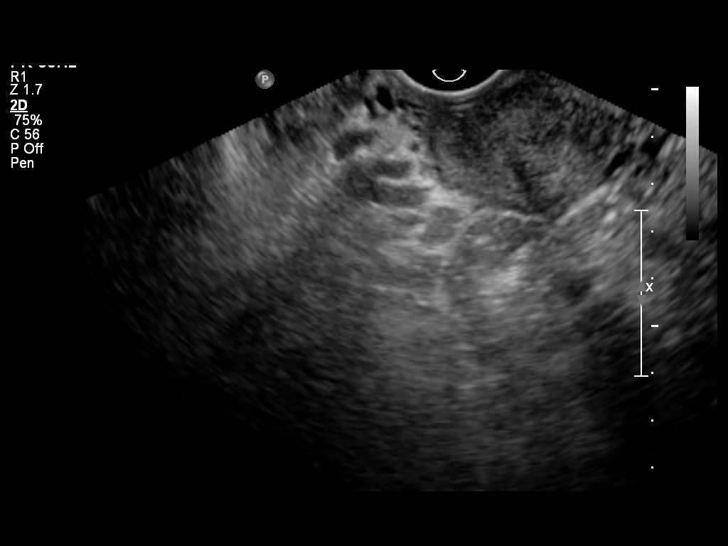
[im 28/28]
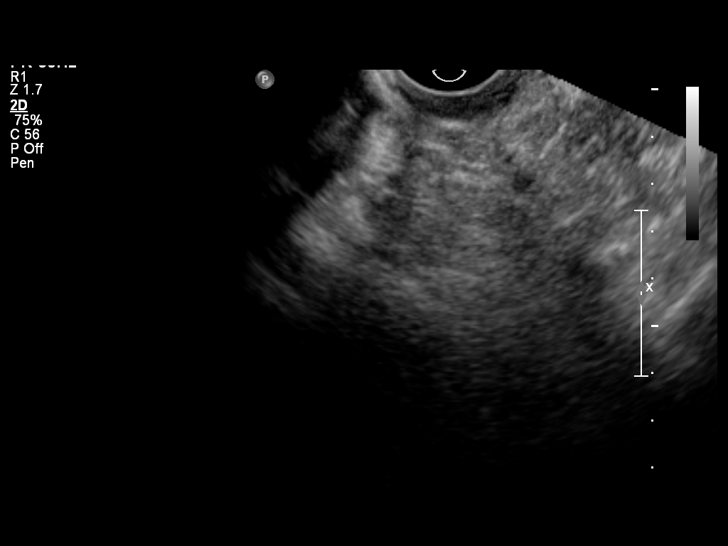

[14 of 25 positions shown; findings below may reference images not displayed]

FINDINGS: The uterus is normal in size, measuring 11.6 x 5.3 x 6.4 cm.  A
cm myometrial fibroid is again noted in the lateral right mid body.
The endometrial stripe remains thin and homogeneous, measuring 8 mm
in width.

Both ovaries have a normal size and appearance.  The left ovary
measures 2.7 x 1.8 x 1.9 cm, and the right ovary measures 2.8 x
x 1.6 cm.  The previously seen right ovarian simple cyst has
resolved.  There are no adnexal masses or free pelvic fluid.
IMPRESSION: Stable appearance of fibroid uterus.

Interval resolution of right ovarian simple cyst.  Both ovaries
have a normal appearance today..

## 2013-11-10 ENCOUNTER — Ambulatory Visit: Payer: Self-pay | Admitting: Obstetrics & Gynecology

## 2014-08-28 ENCOUNTER — Encounter: Payer: Self-pay | Admitting: *Deleted

## 2014-08-29 ENCOUNTER — Encounter: Payer: Self-pay | Admitting: Obstetrics & Gynecology

## 2014-10-05 ENCOUNTER — Ambulatory Visit: Payer: BLUE CROSS/BLUE SHIELD | Attending: Gynecologic Oncology | Admitting: Gynecologic Oncology

## 2014-10-05 ENCOUNTER — Other Ambulatory Visit (HOSPITAL_COMMUNITY)
Admission: RE | Admit: 2014-10-05 | Discharge: 2014-10-05 | Disposition: A | Discharge status: Home / Self Care | Payer: BLUE CROSS/BLUE SHIELD | Source: Ambulatory Visit | Attending: Obstetrics & Gynecology | Admitting: Obstetrics & Gynecology

## 2014-10-05 ENCOUNTER — Encounter: Payer: Self-pay | Admitting: Gynecologic Oncology

## 2014-10-05 VITALS — BP 124/78 | HR 63 | Temp 98.9°F | Resp 16 | Ht 63.5 in | Wt 161.0 lb

## 2014-10-05 DIAGNOSIS — N8320 Unspecified ovarian cysts: Secondary | ICD-10-CM

## 2014-10-05 DIAGNOSIS — N939 Abnormal uterine and vaginal bleeding, unspecified: Secondary | ICD-10-CM | POA: Insufficient documentation

## 2014-10-05 DIAGNOSIS — Z01419 Encounter for gynecological examination (general) (routine) without abnormal findings: Secondary | ICD-10-CM

## 2014-10-05 DIAGNOSIS — D259 Leiomyoma of uterus, unspecified: Secondary | ICD-10-CM | POA: Diagnosis not present

## 2014-10-05 DIAGNOSIS — Z8742 Personal history of other diseases of the female genital tract: Secondary | ICD-10-CM | POA: Diagnosis not present

## 2014-10-05 DIAGNOSIS — Z1151 Encounter for screening for human papillomavirus (HPV): Secondary | ICD-10-CM | POA: Diagnosis present

## 2014-10-05 DIAGNOSIS — N83209 Unspecified ovarian cyst, unspecified side: Secondary | ICD-10-CM

## 2014-10-05 DIAGNOSIS — Z01411 Encounter for gynecological examination (general) (routine) with abnormal findings: Secondary | ICD-10-CM | POA: Diagnosis not present

## 2014-10-05 DIAGNOSIS — D251 Intramural leiomyoma of uterus: Secondary | ICD-10-CM

## 2014-10-05 NOTE — Progress Notes (Signed)
Assessment:  Moderately enlarged fibroid uterus--bulky, pain symptoms controlled w/OTC NSAIDS Unscheduled bleeding on COCP--self-limited H/O a functional ovarian cyst   Plan:      Meds ordered this encounter  Medications  . Naproxen Sodium (ALEVE PO)    Sig: Take 1 tablet by mouth as needed.   Orders Placed This Encounter  Procedures  . US Pelvis Complete    Standing Status: Future     Number of Occurrences:      Standing Expiration Date: 10/05/2015    Order Specific Question:  Reason for Exam (SYMPTOM  OR DIAGNOSIS REQUIRED)    Answer:  abnormal uterine bleeding    Order Specific Question:  Preferred imaging location?    Answer:  [facility]    Possible management options include:  Continue COCP/NSAIDS Follow up as needed.      Subjective:        Sheila [name] is a 44 y.o. female here for a routine exam.  Current complaints: recent unscheduled bleeding on COCP.    Personal health questionnaire:  Is patient Ashkenazi Jewish, have a family history of breast and/or ovarian cancer: no Is there a family history of uterine cancer diagnosed at age < 74, gastrointestinal cancer, urinary tract cancer, family member who is a Field seismologist syndrome-associated carrier: no Is the patient overweight and hypertensive, family history of diabetes, personal history of gestational diabetes, preeclampsia or PCOS: no Is patient over 77, have PCOS,  family history of premature CHD under age 71, diabetes, smoke, have hypertension or peripheral artery disease:  no At any time, has a partner hit, kicked or otherwise hurt or frightened you?: no Over the past 2 weeks, have you felt down, depressed or hopeless?: no Over the past 2 weeks, have you felt little interest or pleasure in doing things?:no   Gynecologic History Contraception: OCP (estrogen/progesterone) Last Pap results were: normal Last mammogram: 8/15. Results were: normal  Obstetric History OB History  No data available     History reviewed. No pertinent past medical history.  Past Surgical History  Procedure Laterality Date  . Cesarean section      x 3      Current outpatient prescriptions:  Marland Kitchen  Multiple Vitamin (MULTIVITAMIN) tablet, Take 1 tablet by mouth daily., Disp: , Rfl:  .  Naproxen Sodium (ALEVE PO), Take 1 tablet by mouth as needed., Disp: , Rfl:  No Known Allergies  History  Substance Use Topics  . Smoking status: Never Smoker   . Smokeless tobacco: Never Used  . Alcohol Use: No    History reviewed. No pertinent family history.    Review of Systems  Constitutional: negative for fatigue and weight loss Respiratory: negative for cough and wheezing Cardiovascular: negative for chest pain, fatigue and palpitations Gastrointestinal: negative for abdominal pain and change in bowel habits Musculoskeletal:negative for myalgias Neurological: negative for gait problems and tremors Behavioral/Psych: negative for abusive relationship, depression Endocrine: negative for temperature intolerance   Genitourinary:negative for abnormal menstrual periods, genital lesions, hot flashes, sexual problems and vaginal discharge Integument/breast: negative for breast lump, breast tenderness, nipple discharge and skin lesion(s)    Objective:       BP 124/78 mmHg  Pulse 63  Temp(Src) 98.9 F (37.2 C) (Oral)  Resp 16  Ht 5' 3.5" (1.613 m)  Wt 161 lb (73.029 kg)  BMI 28.07 kg/m2 General:   alert  Skin:   no rash or abnormalities  Lungs:   clear to auscultation bilaterally  Heart:   regular rate and  rhythm, S1, S2 normal, no murmur, click, rub or gallop  Breasts:   normal without suspicious masses, skin or nipple changes or axillary nodes  Abdomen:  normal findings: no organomegaly, soft, non-tender and no hernia  Pelvis:  External genitalia: normal general appearance Urinary system: urethral meatus normal and bladder without fullness, nontender Vaginal: normal without tenderness, induration or  masses Cervix: normal appearance Adnexa: normal bimanual exam Uterus: anteverted/anteflexed approximately 12 wks size in aggregate--adherent to anterior abdominal wall, slightly mobile    Lab Review Urine pregnancy test Labs reviewed no Radiologic studies reviewed yes

## 2014-10-05 NOTE — Patient Instructions (Signed)

## 2014-10-10 LAB — CYTOLOGY - PAP

## 2014-10-12 ENCOUNTER — Telehealth: Payer: Self-pay | Admitting: *Deleted

## 2014-10-12 NOTE — Telephone Encounter (Signed)
Lm for pt to call concerning results. Message for pt: Pap smear normal, showing some yeast recommendation to try OTC yeast medicine or we can call in Diflucan. Awaiting pt call back.

## 2014-10-13 ENCOUNTER — Ambulatory Visit (HOSPITAL_COMMUNITY)
Admission: RE | Admit: 2014-10-13 | Discharge: 2014-10-13 | Disposition: A | Discharge status: Home / Self Care | Payer: BLUE CROSS/BLUE SHIELD | Source: Ambulatory Visit | Attending: Obstetrics & Gynecology | Admitting: Obstetrics & Gynecology

## 2014-10-13 ENCOUNTER — Encounter: Payer: Self-pay | Admitting: Obstetrics & Gynecology

## 2014-10-13 ENCOUNTER — Other Ambulatory Visit: Payer: Self-pay | Admitting: Obstetrics & Gynecology

## 2014-10-13 DIAGNOSIS — D251 Intramural leiomyoma of uterus: Secondary | ICD-10-CM | POA: Diagnosis not present

## 2014-10-13 DIAGNOSIS — D250 Submucous leiomyoma of uterus: Secondary | ICD-10-CM | POA: Insufficient documentation

## 2014-10-13 DIAGNOSIS — N939 Abnormal uterine and vaginal bleeding, unspecified: Secondary | ICD-10-CM | POA: Diagnosis present

## 2014-10-13 DIAGNOSIS — N949 Unspecified condition associated with female genital organs and menstrual cycle: Secondary | ICD-10-CM | POA: Diagnosis not present

## 2014-10-16 NOTE — Telephone Encounter (Signed)
Patient returned call regarding pap smear results - let patient know her pap smear was normal. She denies any symptoms of a yeast infection - told her that if she does notice any symptoms that she can use OTC yeast medicine. Patient agreeable to this.

## 2014-10-23 ENCOUNTER — Other Ambulatory Visit: Payer: Self-pay | Admitting: Obstetrics & Gynecology

## 2014-10-23 MED ORDER — LETROZOLE 2.5 MG PO TABS: 2.5000 mg | ORAL_TABLET | Freq: Every day | ORAL | Status: DC

## 2014-10-23 MED ORDER — NORETHINDRONE ACETATE 5 MG PO TABS: 5.0000 mg | ORAL_TABLET | Freq: Two times a day (BID) | ORAL | Status: DC

## 2014-12-26 ENCOUNTER — Other Ambulatory Visit: Payer: Self-pay | Admitting: Obstetrics & Gynecology

## 2014-12-26 DIAGNOSIS — N946 Dysmenorrhea, unspecified: Secondary | ICD-10-CM

## 2015-08-11 MED ORDER — MEFENAMIC ACID 250 MG PO CAPS: 1.0000 | ORAL_CAPSULE | Freq: Four times a day (QID) | ORAL | Status: DC | PRN

## 2015-08-11 MED ORDER — NORETHINDRONE ACET-ETHINYL EST 1-20 MG-MCG PO TABS: 1.0000 | ORAL_TABLET | Freq: Every day | ORAL | Status: DC

## 2016-02-13 IMAGING — US US PELVIS COMPLETE
1 series · 14 of 25 positions shown · non-contrast
Comparison: 04/08/2013

CLINICAL DATA: Abnormal uterine bleeding, uterine fibroids, right
pelvic pain x3 weeks

EXAM:
TRANSABDOMINAL AND TRANSVAGINAL ULTRASOUND OF PELVIS
TECHNIQUE: Both transabdominal and transvaginal ultrasound examinations of the
pelvis were performed. Transabdominal technique was performed for
global imaging of the pelvis including uterus, ovaries, adnexal
regions, and pelvic cul-de-sac. It was necessary to proceed with
endovaginal exam following the transabdominal exam to visualize the
left ovary.

[Series 1: us pelvis complete · 14 of 37 slices shown]
[im 1/37]
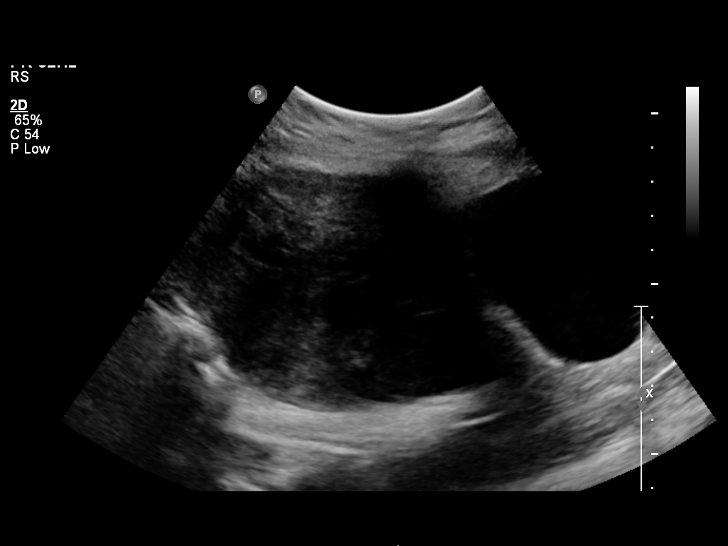
[im 4/37]
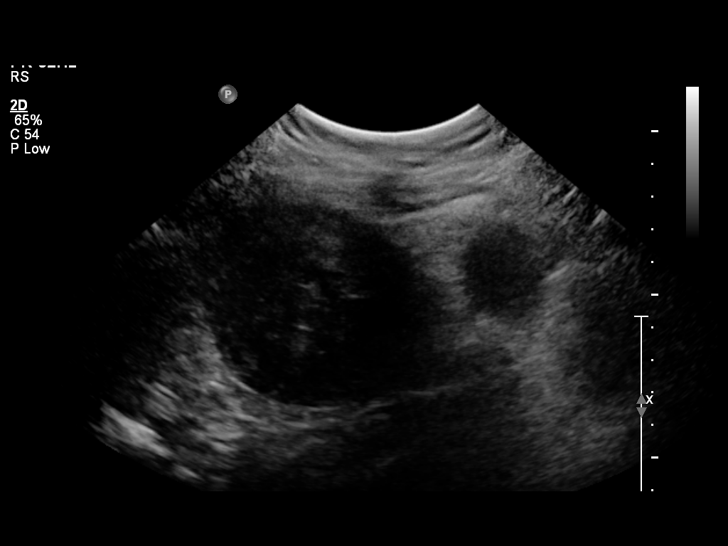
[im 7/37]
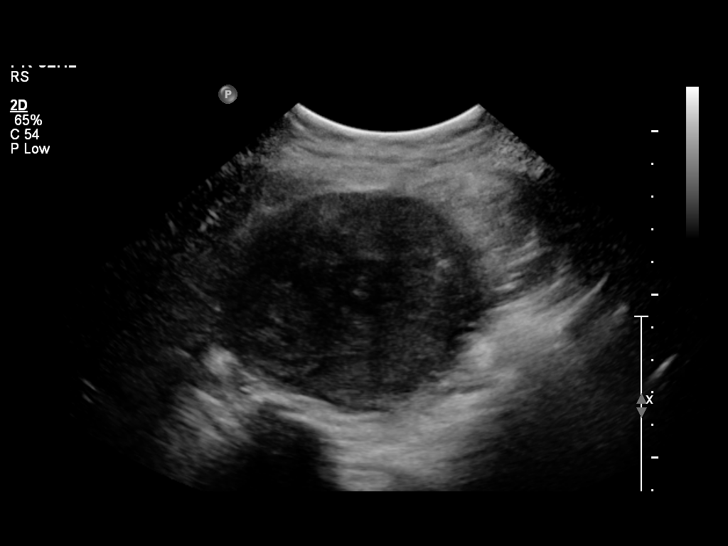
[im 10/37]
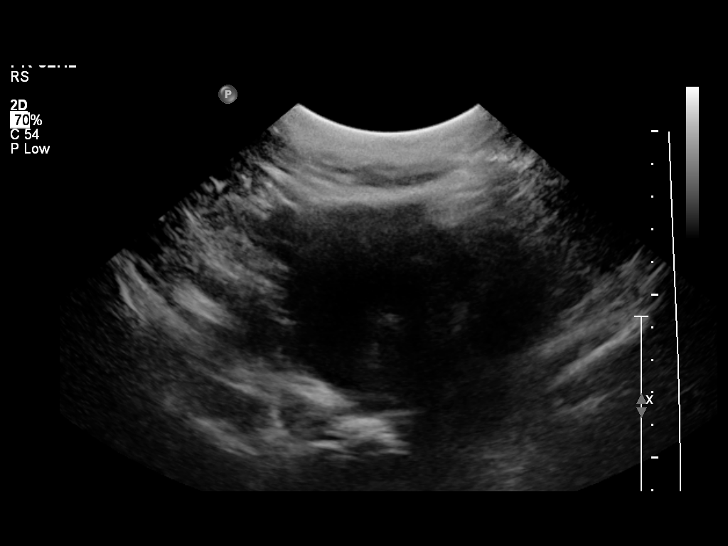
[im 13/37]
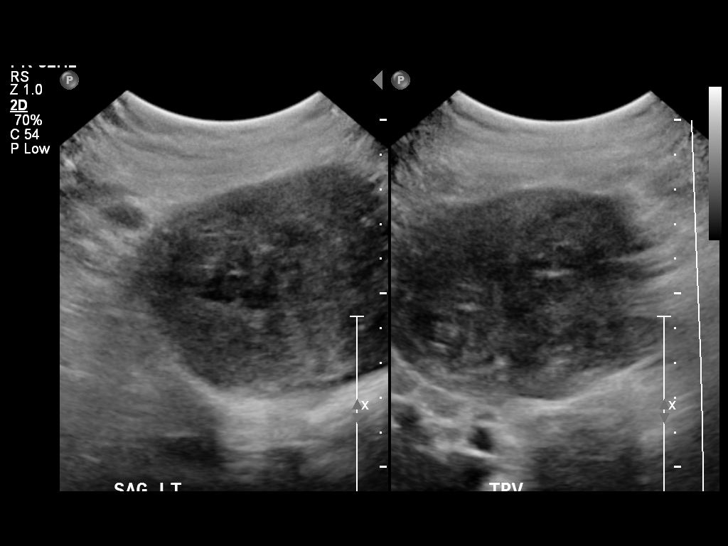
[im 14/37]
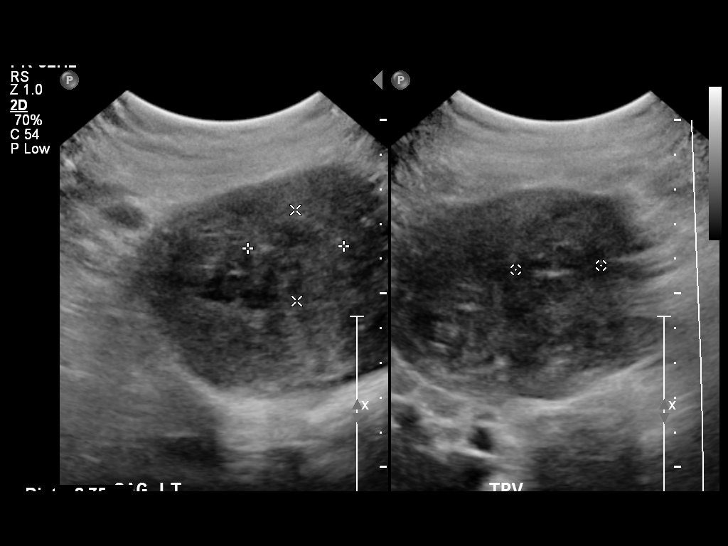
[im 17/37]
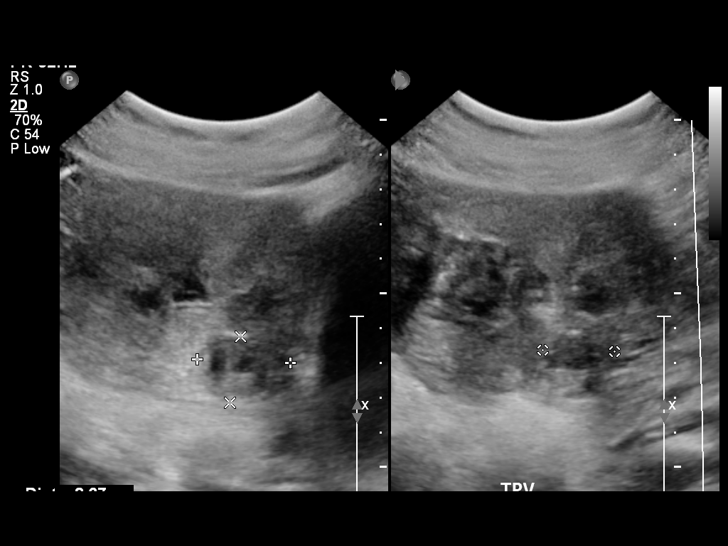
[im 20/37]
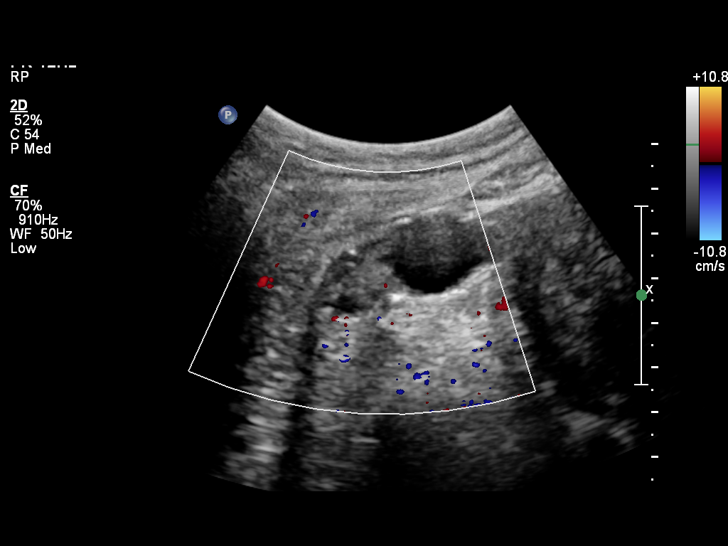
[im 23/37]
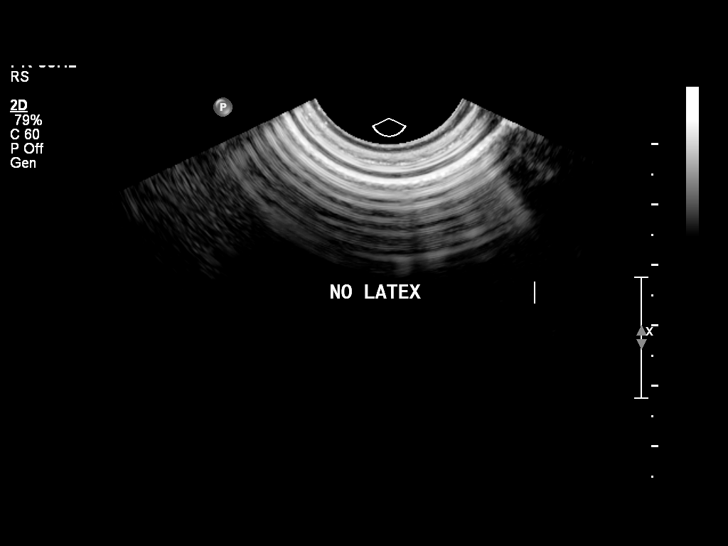
[im 25/37]
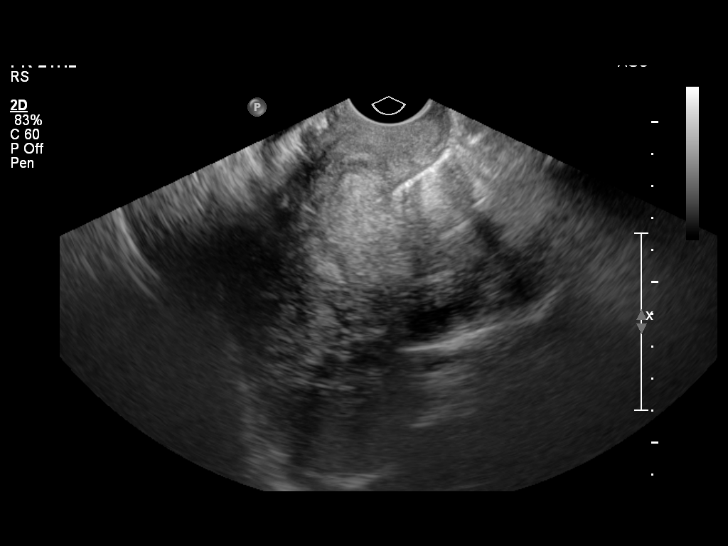
[im 28/37]
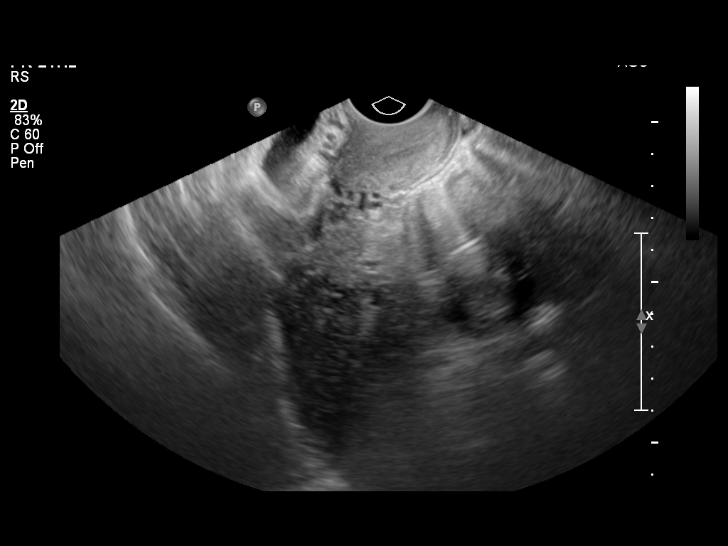
[im 31/37]
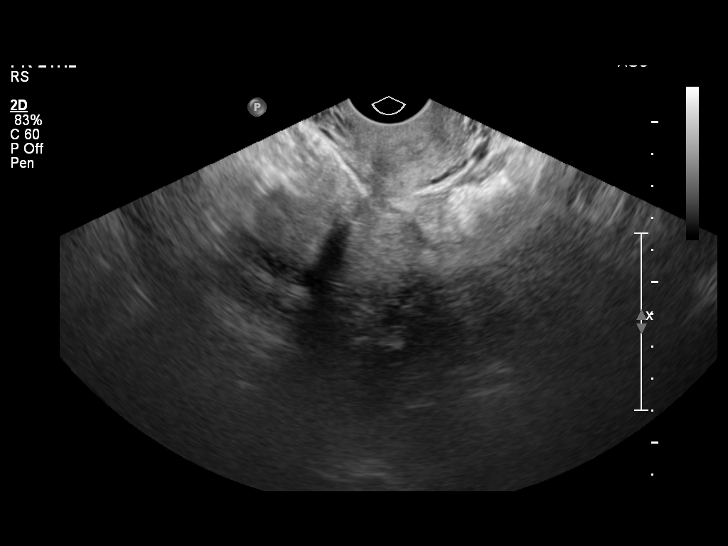
[im 34/37]
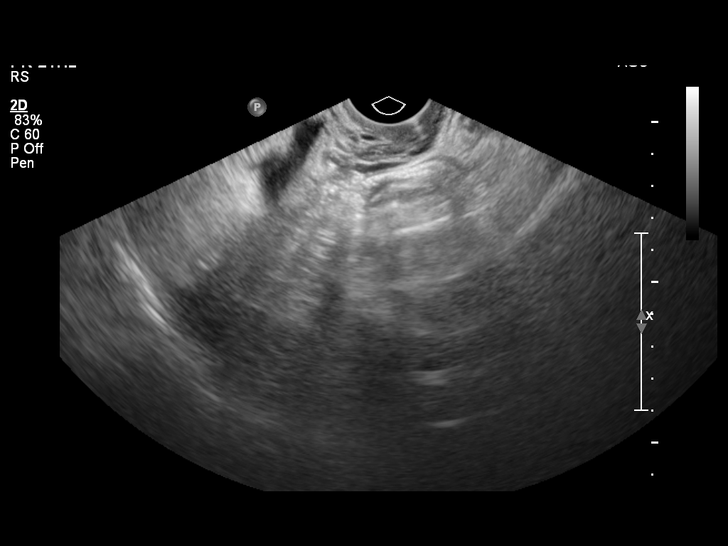
[im 37/37]
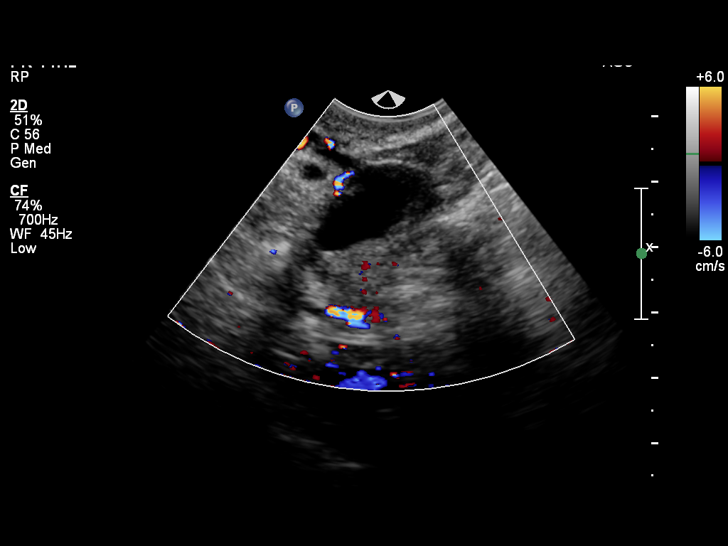

[14 of 25 positions shown; findings below may reference images not displayed]

FINDINGS: Uterus

Measurements: 13.4 x 7.6 x 9.1 cm. Multiple uterine fibroids,
including:

--5.1 x 4.5 x 4.4 cm intramural right fundal fibroid

--2.8 x 2.6 x 2.5 cm submucosal left fundal fibroid

--2.7 x 1.9 x 2.1 cm submucosal posterior lower uterine body fibroid

Endometrium

Poorly visualized due to uterine fibroids.

Right ovary

Measurements: 4.2 x 2.6 x 2.0 cm. Normal appearance/no adnexal mass.

Left ovary

Measurements: 2.4 x 2.0 x 1.7 cm. Normal appearance/no adnexal mass.

Other findings

No free fluid.
IMPRESSION: Multiple uterine fibroids, measuring up to 5.1 cm in the right
uterine fundus, as above.

## 2017-01-13 ENCOUNTER — Other Ambulatory Visit: Payer: Self-pay | Admitting: Obstetrics & Gynecology

## 2017-01-13 DIAGNOSIS — N946 Dysmenorrhea, unspecified: Secondary | ICD-10-CM

## 2017-01-13 MED ORDER — MEFENAMIC ACID 250 MG PO CAPS: 1.0000 | ORAL_CAPSULE | Freq: Four times a day (QID) | ORAL | 3 refills | Status: DC | PRN

## 2017-01-13 NOTE — Progress Notes (Signed)
Per pt request

## 2017-06-01 ENCOUNTER — Other Ambulatory Visit: Payer: Self-pay | Admitting: Obstetrics & Gynecology

## 2017-06-01 DIAGNOSIS — N921 Excessive and frequent menstruation with irregular cycle: Secondary | ICD-10-CM

## 2017-06-01 MED ORDER — ESTROGENS CONJUGATED 1.25 MG PO TABS: 1.2500 mg | ORAL_TABLET | Freq: Every day | ORAL | 0 refills | Status: DC

## 2017-06-01 NOTE — Progress Notes (Signed)
Unscheduled bleeding on COCP.

## 2017-07-10 ENCOUNTER — Other Ambulatory Visit: Payer: Self-pay | Admitting: Internal Medicine

## 2017-07-10 DIAGNOSIS — D259 Leiomyoma of uterus, unspecified: Secondary | ICD-10-CM

## 2018-02-27 ENCOUNTER — Other Ambulatory Visit: Payer: Self-pay | Admitting: Obstetrics & Gynecology

## 2018-02-27 DIAGNOSIS — N946 Dysmenorrhea, unspecified: Secondary | ICD-10-CM

## 2018-02-27 MED ORDER — NORETHINDRONE ACET-ETHINYL EST 1-20 MG-MCG PO TABS: 1.0000 | ORAL_TABLET | Freq: Every day | ORAL | 4 refills | Status: DC

## 2018-05-17 ENCOUNTER — Other Ambulatory Visit: Payer: Self-pay | Admitting: Obstetrics & Gynecology

## 2018-05-17 DIAGNOSIS — N946 Dysmenorrhea, unspecified: Secondary | ICD-10-CM

## 2018-05-17 MED ORDER — ESTROGENS CONJUGATED 1.25 MG PO TABS: 1.2500 mg | ORAL_TABLET | Freq: Every day | ORAL | 0 refills | Status: DC

## 2018-05-17 MED ORDER — MEFENAMIC ACID 250 MG PO CAPS: 1.0000 | ORAL_CAPSULE | Freq: Four times a day (QID) | ORAL | 3 refills | Status: DC | PRN

## 2018-05-17 MED ORDER — NORETHINDRONE ACET-ETHINYL EST 1-20 MG-MCG PO TABS: 1.0000 | ORAL_TABLET | Freq: Every day | ORAL | 4 refills | Status: DC

## 2018-05-17 NOTE — Progress Notes (Signed)
Patient requesting refill of prescriptions.

## 2018-05-18 ENCOUNTER — Other Ambulatory Visit: Payer: Self-pay | Admitting: Obstetrics & Gynecology

## 2018-05-18 DIAGNOSIS — D251 Intramural leiomyoma of uterus: Secondary | ICD-10-CM

## 2018-11-17 ENCOUNTER — Other Ambulatory Visit: Payer: Self-pay | Admitting: Obstetrics & Gynecology

## 2018-11-17 DIAGNOSIS — N946 Dysmenorrhea, unspecified: Secondary | ICD-10-CM

## 2018-11-17 MED ORDER — NORETHINDRONE ACET-ETHINYL EST 1-20 MG-MCG PO TABS: 1.0000 | ORAL_TABLET | Freq: Every day | ORAL | 4 refills | Status: DC

## 2019-01-12 ENCOUNTER — Other Ambulatory Visit: Payer: Self-pay | Admitting: Obstetrics & Gynecology

## 2019-01-12 DIAGNOSIS — N946 Dysmenorrhea, unspecified: Secondary | ICD-10-CM

## 2019-01-12 DIAGNOSIS — D251 Intramural leiomyoma of uterus: Secondary | ICD-10-CM

## 2019-01-12 MED ORDER — MEFENAMIC ACID 250 MG PO CAPS: 1.0000 | ORAL_CAPSULE | Freq: Four times a day (QID) | ORAL | 3 refills | Status: DC | PRN

## 2019-01-12 MED ORDER — TRANEXAMIC ACID 650 MG PO TABS
650.0000 mg | ORAL_TABLET | Freq: Three times a day (TID) | ORAL | 2 refills | Status: AC
Start: 2019-01-12 — End: 2019-01-17

## 2019-01-12 NOTE — Progress Notes (Signed)
The patient called w/a prolonged episode of vaginal bleeding.  Requesting a refill of the Ponstel.

## 2019-04-09 ENCOUNTER — Other Ambulatory Visit: Payer: Self-pay | Admitting: Obstetrics & Gynecology

## 2019-04-09 DIAGNOSIS — N946 Dysmenorrhea, unspecified: Secondary | ICD-10-CM

## 2019-04-09 MED ORDER — NORETHINDRONE ACET-ETHINYL EST 1-20 MG-MCG PO TABS: 1.0000 | ORAL_TABLET | Freq: Every day | ORAL | 4 refills | Status: DC

## 2019-12-14 ENCOUNTER — Other Ambulatory Visit: Payer: Self-pay | Admitting: Obstetrics & Gynecology

## 2019-12-14 DIAGNOSIS — N649 Disorder of breast, unspecified: Secondary | ICD-10-CM

## 2020-03-01 ENCOUNTER — Other Ambulatory Visit: Payer: Self-pay | Admitting: Obstetrics & Gynecology

## 2020-03-01 DIAGNOSIS — N946 Dysmenorrhea, unspecified: Secondary | ICD-10-CM

## 2020-03-01 MED ORDER — NORETHINDRONE ACET-ETHINYL EST 1-20 MG-MCG PO TABS: 1.0000 | ORAL_TABLET | Freq: Every day | ORAL | 4 refills | Status: DC

## 2020-07-12 ENCOUNTER — Other Ambulatory Visit: Payer: Self-pay | Admitting: Radiology

## 2020-07-13 ENCOUNTER — Telehealth: Payer: Self-pay | Admitting: Oncology

## 2020-07-13 NOTE — Telephone Encounter (Signed)
Spoke with patient to confirm afternoon [facility] appointment

## 2020-07-16 ENCOUNTER — Encounter: Payer: Self-pay | Admitting: *Deleted

## 2020-07-16 ENCOUNTER — Encounter: Payer: Self-pay | Admitting: Internal Medicine

## 2020-07-16 DIAGNOSIS — D0512 Intraductal carcinoma in situ of left breast: Secondary | ICD-10-CM

## 2020-07-16 DIAGNOSIS — C50112 Malignant neoplasm of central portion of left female breast: Secondary | ICD-10-CM | POA: Insufficient documentation

## 2020-07-17 ENCOUNTER — Other Ambulatory Visit: Payer: Self-pay | Admitting: Obstetrics & Gynecology

## 2020-07-17 DIAGNOSIS — D251 Intramural leiomyoma of uterus: Secondary | ICD-10-CM

## 2020-07-17 MED ORDER — TRANEXAMIC ACID 650 MG PO TABS: 1300.0000 mg | ORAL_TABLET | Freq: Three times a day (TID) | ORAL | 3 refills | Status: DC

## 2020-07-17 NOTE — Progress Notes (Signed)
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 She thought it might have been a burn from a hot water bottle or a slight infection. She underwent routine  bilateral diagnostic mammography with tomography at [facility] on 06/15/2020 showing: breast density category B; 2 cm grouped calcifications in upper-outer left breast.  Accordingly on 07/12/2020 she proceeded to biopsy of the left breast area in question. The pathology from this procedure ([id number]) showed: ductal carcinoma in situ, intermediate to high grade, with necrosis and calcifications, focally suspicious for microinvasion. Prognostic indicators significant for: estrogen receptor, 70% positive and progesterone receptor, 5% positive, both with moderate-strong staining intensity.   The patient's subsequent history is as detailed below.   INTERVAL HISTORY: Sheila [name] was evaluated in the multidisciplinary breast cancer clinic on 07/18/2020 accompanied by her husband and daughter. Her case was also presented at the multidisciplinary breast cancer conference on the same day. At that time a preliminary plan was proposed: niple biopsy,  breast conserving surgery, adjuvant  radiation, anti-estrogens   REVIEW OF SYSTEMS: On the provided questionnaire, Sheila [name] reports abdominal cramps, wearing glasses while driving, and a rash to her left nipple that has been "on and off for 6 months." The patient denies unusual headaches, visual changes, nausea, vomiting, stiff neck, dizziness, or gait imbalance. There has been no cough, phlegm production, or pleurisy, no chest pain or pressure, and no change in bowel or bladder habits. The patient denies fever, rash, bleeding, unexplained fatigue or unexplained weight loss. A detailed review of systems was otherwise entirely negative.   COVID 19 VACCINATION STATUS:    PAST MEDICAL HISTORY: Past Medical History:  Diagnosis Date  . Breast cancer ([place])   . Seasonal allergies     History of childhood asthma, uterine fibroids, seasoal allergies   PAST SURGICAL HISTORY: Past Surgical History:  Procedure Laterality Date  . CESAREAN SECTION     x 3     FAMILY HISTORY: History reviewed. No pertinent family history.  Her father died at age 63 from small bowel obstruction. Her mother is living at age 79 as of 07/2020. Sheila [name] has three brothers (and no sisters). There is no family history of cancer to her knowledge.   GYNECOLOGIC HISTORY:  No LMP recorded. Menarche: 49 years old Age at first live birth: 49 years old [place] P 3 LMP "ongoing" Contraceptive: used for about 10 years HRT n/a  Hysterectomy? no BSO? 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 She is originally from [place], came to the [place] age 49.. Husband Dr. Nolene Ebbs is of course one of our internists locally. 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  She lives at home with husband Sheila [name] and their daughter Sheila [name], who is 56. Daughter Sheila [name], age  24, is an Forensic psychologist in [place]. Daughter Sheila [name], age 41, is a Careers information officer in [place].     ADVANCED DIRECTIVES: In the absence  of any documentation to the contrary, the patient's spouse is their HCPOA.    HEALTH MAINTENANCE: Social History   Tobacco Use  . Smoking status: Never Smoker  . Smokeless tobacco: Never Used  Substance Use Topics  . Alcohol use: No  . Drug use: No     Colonoscopy: Cologard pending  PAP: 2021  Bone density: never done (age)   No Known Allergies  Current Outpatient Medications  Medication Sig Dispense Refill  . Multiple Vitamin (MULTIVITAMIN) tablet Take 1 tablet by mouth daily.    . norethindrone-ethinyl estradiol (LOESTRIN FE) 1-20 MG-MCG tablet Take 1 tablet by mouth daily.     No current facility-administered medications for this visit.    OBJECTIVE: African=American woman who appears younger than stated age  9:   07/18/20 1246  BP: (!) 147/82  Pulse: 85  Resp: 20  Temp: 98.9 F (37.2 C)  SpO2: 99%     Body mass index is 29.87 kg/m.   Wt Readings from Last 3 Encounters:  07/18/20 174 lb (78.9 kg)  10/05/14 161 lb (73 kg)  04/11/13 166 lb (75.3 kg)      ECOG FS:1 - Symptomatic but completely ambulatory  Ocular: Sclerae unicteric, pupils round and equal Ear-nose-throat: Wearing a mask Lymphatic: No cervical or supraclavicular adenopathy Lungs no rales or rhonchi Heart regular rate and rhythm Abd soft, nontender, positive bowel sounds MSK no focal spinal tenderness, no joint edema Neuro: non-focal, well-oriented, appropriate affect Breasts: the right breast is unremarkable; the left breast is s/p recent biopsy. I do not palpate a mass. The nipple is erythematous and flat, no palpable mass. Both axillae are benign.   LAB RESULTS:  CMP     Component Value Date/Time   NA 137 07/18/2020 1229   K 3.8 07/18/2020 1229   CL 105 07/18/2020 1229   CO2 25 07/18/2020 1229   GLUCOSE 82 07/18/2020 1229   BUN 8 07/18/2020 1229   CREATININE 0.81 07/18/2020 1229   CALCIUM 9.1 07/18/2020 1229   PROT 7.6 07/18/2020 1229   ALBUMIN 3.7 07/18/2020 1229   AST  20 07/18/2020 1229   ALT 18 07/18/2020 1229   ALKPHOS 47 07/18/2020 1229   BILITOT 0.5 07/18/2020 1229   GFRNONAA >60 07/18/2020 1229    No results found for: TOTALPROTELP, ALBUMINELP, A1GS, A2GS, BETS, BETA2SER, GAMS, MSPIKE, SPEI  Lab Results  Component Value Date   WBC 7.6 07/18/2020   NEUTROABS 4.1 07/18/2020   HGB 10.5 (L) 07/18/2020   HCT 34.8 (L) 07/18/2020   MCV 73.0 (L) 07/18/2020   PLT 380 07/18/2020    No results found for: LABCA2  No components found for: AYTKZS010  No results for input(s): INR in the last 168 hours.  No results found for: LABCA2  No results found for: XNA355  No results found for: DDU202  No results found for: RKY706  No results found for: CA2729  No components found for: HGQUANT  No results found for: CEA1 / No results found for: CEA1   No results found for: AFPTUMOR  No results found for: CHROMOGRNA  No results found for: KPAFRELGTCHN, LAMBDASER, KAPLAMBRATIO (kappa/lambda light chains)  No results found for: HGBA, HGBA2QUANT, HGBFQUANT, HGBSQUAN (Hemoglobinopathy evaluation)   No results found for: LDH  No results found for: IRON, TIBC, IRONPCTSAT (Iron and TIBC)  No results found  for: FERRITIN  Urinalysis No results found for: COLORURINE, APPEARANCEUR, LABSPEC, PHURINE, GLUCOSEU, HGBUR, BILIRUBINUR, KETONESUR, PROTEINUR, UROBILINOGEN, NITRITE, LEUKOCYTESUR   STUDIES: No results found.   ELIGIBLE FOR AVAILABLE RESEARCH PROTOCOL: AET  ASSESSMENT: 49 y.o. [place] woman s/p Left breast biopsy 07/12/2020 for ductal carcinoma in situ, grade 2 or 3, estrogen and progesterone receptor ositive.  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 Specifically we discussed the biology of her breast cancer, its diagnosis,  staging, treatment  options and prognosis. Nawal understands that in noninvasive ductal carcinoma, also called ductal carcinoma in situ ("DCIS") the breast cancer cells remain trapped in the ducts were they started. They cannot travel to a vital organ. For that reason these cancers in themselves are not life-threatening.  If the whole breast is removed then all the ducts are removed and since the cancer cells are trapped in the ducts, the cure rate with mastectomy for noninvasive breast cancer is approximately 99%. Nevertheless we recommend lumpectomy, because there is no survival advantage to mastectomy and because the cosmetic result is generally superior with breast conservation.  Since the patient is keeping her breast, there will be some risk of recurrence. The recurrence can only be in the same breast since, again, the cells are trapped in the ducts. There is no connection from one breast to the other. The risk of local recurrence is cut by more than half with radiation, which is standard in this situation.  In estrogen receptor positive cancers anti-estrogens can also be considered. They will further reduce the risk of recurrence by one half. In addition anti-estrogens will lower the risk of a new breast cancer developing in either breast, also by one half. That risk otherwise approaches 1% per year.   Accordingly the overall plan is for surgery, followed by radiation, then a discussion of anti-estrogens.  Mayre has a good understanding of the overall plan. She agrees with it. She knows the goal of treatment in her case is cure. She will call with any problems that may develop before her next visit here.  Total encounter time 55 minutes. Sheila Jews C. Jaxan Michel, MD 07/18/2020 5:57 PM Medical Oncology and Hematology [facility] [place], [place] [address] Tel. [phone]    Fax. [phone]   This document serves as a record of services personally  performed by Lurline Del, MD. It was created on his behalf by Wilburn Mylar, a trained medical scribe. The creation of this record is based on the scribe's personal observations and the provider's statements to them.   I, Lurline Del MD, have reviewed the above documentation for accuracy and completeness, and I agree with the above.    *Total Encounter Time as defined by the Centers for Medicare and Medicaid Services includes, in addition to the face-to-face time of a patient visit (documented in the note above) non-face-to-face time: obtaining and reviewing outside history, ordering and reviewing medications, tests or procedures, care coordination (communications with other health care professionals or caregivers) and documentation in the medical record.

## 2020-07-18 ENCOUNTER — Inpatient Hospital Stay: Payer: 59

## 2020-07-18 ENCOUNTER — Ambulatory Visit
Admission: RE | Admit: 2020-07-18 | Discharge: 2020-07-18 | Disposition: A | Discharge status: Home / Self Care | Payer: 59 | Source: Ambulatory Visit | Attending: Radiation Oncology | Admitting: Radiation Oncology

## 2020-07-18 ENCOUNTER — Inpatient Hospital Stay: Payer: 59 | Attending: Oncology | Admitting: Oncology

## 2020-07-18 ENCOUNTER — Ambulatory Visit (HOSPITAL_BASED_OUTPATIENT_CLINIC_OR_DEPARTMENT_OTHER): Payer: 59 | Admitting: Genetic Counselor

## 2020-07-18 ENCOUNTER — Ambulatory Visit: Payer: 59 | Admitting: Physical Therapy

## 2020-07-18 ENCOUNTER — Encounter: Payer: Self-pay | Admitting: Oncology

## 2020-07-18 ENCOUNTER — Other Ambulatory Visit: Payer: Self-pay

## 2020-07-18 VITALS — BP 147/82 | HR 85 | Temp 98.9°F | Resp 20 | Ht 64.0 in | Wt 174.0 lb

## 2020-07-18 DIAGNOSIS — Z170 Estrogen receptor positive status [ER+]: Secondary | ICD-10-CM | POA: Insufficient documentation

## 2020-07-18 DIAGNOSIS — D0512 Intraductal carcinoma in situ of left breast: Secondary | ICD-10-CM | POA: Insufficient documentation

## 2020-07-18 DIAGNOSIS — C50012 Malignant neoplasm of nipple and areola, left female breast: Secondary | ICD-10-CM | POA: Insufficient documentation

## 2020-07-18 LAB — CBC WITH DIFFERENTIAL (CANCER CENTER ONLY)
Abs Immature Granulocytes: 0.01 10*3/uL (ref 0.00–0.07)
Basophils Absolute: 0.1 10*3/uL (ref 0.0–0.1)
Basophils Relative: 1 %
Eosinophils Absolute: 0.8 10*3/uL — ABNORMAL HIGH (ref 0.0–0.5)
Eosinophils Relative: 10 %
HCT: 34.8 % — ABNORMAL LOW (ref 36.0–46.0)
Hemoglobin: 10.5 g/dL — ABNORMAL LOW (ref 12.0–15.0)
Immature Granulocytes: 0 %
Lymphocytes Relative: 27 %
Lymphs Abs: 2 10*3/uL (ref 0.7–4.0)
MCH: 22 pg — ABNORMAL LOW (ref 26.0–34.0)
MCHC: 30.2 g/dL (ref 30.0–36.0)
MCV: 73 fL — ABNORMAL LOW (ref 80.0–100.0)
Monocytes Absolute: 0.6 10*3/uL (ref 0.1–1.0)
Monocytes Relative: 8 %
Neutro Abs: 4.1 10*3/uL (ref 1.7–7.7)
Neutrophils Relative %: 54 %
Platelet Count: 380 10*3/uL (ref 150–400)
RBC: 4.77 MIL/uL (ref 3.87–5.11)
RDW: 17.8 % — ABNORMAL HIGH (ref 11.5–15.5)
WBC Count: 7.6 10*3/uL (ref 4.0–10.5)
nRBC: 0 % (ref 0.0–0.2)

## 2020-07-18 LAB — CMP (CANCER CENTER ONLY)
ALT: 18 U/L (ref 0–44)
AST: 20 U/L (ref 15–41)
Albumin: 3.7 g/dL (ref 3.5–5.0)
Alkaline Phosphatase: 47 U/L (ref 38–126)
Anion gap: 7 (ref 5–15)
BUN: 8 mg/dL (ref 6–20)
CO2: 25 mmol/L (ref 22–32)
Calcium: 9.1 mg/dL (ref 8.9–10.3)
Chloride: 105 mmol/L (ref 98–111)
Creatinine: 0.81 mg/dL (ref 0.44–1.00)
GFR, Estimated: >60 mL/min (ref >60)
Glucose, Bld: 82 mg/dL (ref 70–99)
Potassium: 3.8 mmol/L (ref 3.5–5.1)
Sodium: 137 mmol/L (ref 135–145)
Total Bilirubin: 0.5 mg/dL (ref 0.3–1.2)
Total Protein: 7.6 g/dL (ref 6.5–8.1)

## 2020-07-18 LAB — GENETIC SCREENING ORDER

## 2020-07-18 NOTE — Progress Notes (Signed)
Radiation Oncology         [phone] ________________________________  Initial Outpatient Consultation  Name: Sheila [name] MRN: [id number]  Date: 07/18/2020  DOB: Jan 09, 1971  DX:[id number], Joelene Millin, MD  [name], MD   REFERRING PHYSICIAN: [name], MD  DIAGNOSIS:    ICD-10-CM   1. Ductal carcinoma in situ (DCIS) of left breast  D05.12     Cancer Staging Ductal carcinoma in situ (DCIS) of left breast Staging form: Breast, AJCC 8th Edition - Clinical stage from 07/18/2020: Stage 0 (cTis (DCIS), cN0, cM0, ER+, PR+) - Unsigned  Stage 0 Left Breast UOQ, Ductal Carcinoma In Situ, ER+ / PR+, Grade 2-3  CHIEF COMPLAINT: Here to discuss management of left breast cancer  HISTORY OF PRESENT ILLNESS:Sheila [name] is a 49 y.o. female who presented with left breast abnormality on the following imaging: bilateral diagnostic mammogram on the date of 06/15/2020. Symptoms, if any, at that time, were: five to six-month history of itching, scabbing, peeling, and redness of the left nipple. Results showed 2 cm grouped amorphous and punctate calcifications in the UOQ of the left breast that were suspicious for DCIS. Biopsy on the date of 07/12/2020 revealed ductal carcinoma in situ with necrosis and calcifications. There was also suspicion for focal microinvasion. ER status: 70% moderate to strong; PR status: 5% moderate to strong; Grade: 2-3.  She is with her daughter today and her husband is on speaker phone.   PREVIOUS RADIATION THERAPY: No  PAST MEDICAL HISTORY:  has a past medical history of Breast cancer (H[facility]) and Seasonal allergies.    PAST SURGICAL HISTORY: Past Surgical History:  Procedure Laterality Date  . CESAREAN SECTION     x 3     FAMILY HISTORY: family history is not on file.  SOCIAL HISTORY:  reports that she has never smoked. She has never used smokeless tobacco. She reports that she does not drink alcohol and does not use drugs.  ALLERGIES: Patient has  no known allergies.  MEDICATIONS:  Current Outpatient Medications  Medication Sig Dispense Refill  . Multiple Vitamin (MULTIVITAMIN) tablet Take 1 tablet by mouth daily.    . norethindrone-ethinyl estradiol (LOESTRIN FE) 1-20 MG-MCG tablet Take 1 tablet by mouth daily.     No current facility-administered medications for this encounter.    REVIEW OF SYSTEMS: As abpve  PHYSICAL EXAM:  vitals were not taken for this visit.   General: Alert and oriented, in no acute distress HEENT: Head is normocephalic. Extraocular movements are intact.  Heart: Regular in rate and rhythm with no murmurs, rubs, or gallops. Chest: Clear to auscultation bilaterally, with no rhonchi, wheezes, or rales. Neurologic:  No obvious focalities. Speech is fluent. Coordination is intact. Psychiatric: Judgment and insight are intact. Affect is appropriate. Breasts: left nipple areolar complex is desquamated, erythematous. Biopsy-related thickening/firmness remains in UOQ of left breast. No other palpable masses appreciated in the breasts or axillae bilaterally.   ECOG = 0  0 - Asymptomatic (Fully active, able to carry on all predisease activities without restriction)  1 - Symptomatic but completely ambulatory (Restricted in physically strenuous activity but ambulatory and able to carry out work of a light or sedentary nature. For example, light housework, office work)  2 - Symptomatic, <50% in bed during the day (Ambulatory and capable of all self care but unable to carry out any work activities. Up and about more than 50% of waking hours)  3 - Symptomatic, >50% in bed, but not bedbound (Capable of only  limited self-care, confined to bed or chair 50% or more of waking hours)  4 - Bedbound (Completely disabled. Cannot carry on any self-care. Totally confined to bed or chair)  5 - Death   Eustace Pen MM, Creech RH, Tormey DC, et al. [phone]). "Toxicity and response criteria of the [facility]". [place] Oncol. 5 (6): 649-55   LABORATORY DATA:  Lab Results  Component Value Date   WBC 7.6 07/18/2020   HGB 10.5 (L) 07/18/2020   HCT 34.8 (L) 07/18/2020   MCV 73.0 (L) 07/18/2020   PLT 380 07/18/2020   CMP     Component Value Date/Time   NA 137 07/18/2020 1229   K 3.8 07/18/2020 1229   CL 105 07/18/2020 1229   CO2 25 07/18/2020 1229   GLUCOSE 82 07/18/2020 1229   BUN 8 07/18/2020 1229   CREATININE 0.81 07/18/2020 1229   CALCIUM 9.1 07/18/2020 1229   PROT 7.6 07/18/2020 1229   ALBUMIN 3.7 07/18/2020 1229   AST 20 07/18/2020 1229   ALT 18 07/18/2020 1229   ALKPHOS 47 07/18/2020 1229   BILITOT 0.5 07/18/2020 1229   GFRNONAA >60 07/18/2020 1229         RADIOGRAPHY: as above      IMPRESSION/PLAN: Left breast DCIS with possible microinvasion and likely Paget's disease of nipple.  The patient was seen after tumor board discussion.  She anticipates breast conserving surgery (lumpectomy at [place] and likely lumpectomy at nipple/[place].  Nipple biopsy will confirm cause of nipple changes.)  It was a pleasure meeting the patient today. We discussed the risks, benefits, and side effects of radiotherapy. I recommend adjuvant radiotherapy to the left breast to reduce her risk of locoregional recurrence by half.  We discussed that radiation would take approximately 4 weeks to complete and that I would give the patient a few weeks to heal following surgery before starting treatment planning.   We spoke about acute effects including skin irritation and fatigue as well as much less common late effects including internal organ injury or irritation. We spoke about the latest technology that is used to minimize the risk of late effects for patients undergoing radiotherapy to the breast or chest wall. No guarantees of treatment were given. The patient is enthusiastic about proceeding with treatment. I look forward to participating in the patient's care.  I will await her referral back to me  for postoperative follow-up and eventual CT simulation/treatment planning.   On date of service, in total, I spent 48 minutes on this encounter. Patient was seen in person.   __________________________________________   Eppie Gibson, MD  This document serves as a record of services personally performed by Eppie Gibson, MD. It was created on his behalf by Clerance Lav, a trained medical scribe. The creation of this record is based on the scribe's personal observations and the provider's statements to them. This document has been checked and approved by the attending provider.

## 2020-07-18 NOTE — Progress Notes (Signed)
REFERRING PROVIDER: Chauncey Cruel, MD [address] [place],  [place] [address]  PRIMARY PROVIDER:  Willey Blade, MD  PRIMARY REASON FOR VISIT:  1. Ductal carcinoma in situ (DCIS) of left breast    HISTORY OF PRESENT ILLNESS:   Sheila [name], a 49 y.o. female, was seen for a [facility] cancer genetics consultation during breast multidisciplinary clinic at the request of Sheila [name] due to a personal history of ductal carcinoma in situ.  Sheila [name] presents to clinic today with her daughter, Sheila [name], to discuss the possibility of a hereditary predisposition to cancer, to discuss genetic testing, and to further clarify her future cancer risks, as well as potential cancer risks for family members.    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 The preliminary treatment plan includes surgery, consideration for radiation, and anti-estrogens.   RISK FACTORS:  Menarche was at age 60.  First live birth at age 76.  OCP use for approximately 10 years.  Ovaries intact: yes.  Hysterectomy: no.  Menopausal status: having periods.  HRT use: 0 years. Colonoscopy: no; not examined. Mammogram within the last year: yes. Up to date with pelvic exams: yes.   Past Medical History:  Diagnosis Date   Breast cancer ([place])    Seasonal allergies     Past Surgical History:  Procedure Laterality Date   CESAREAN SECTION     x 3     Social History   Socioeconomic History   Marital status: Married    Spouse name: Not on file   Number of children: 3   Years of education: Not on file   Highest education level: Not on file  Occupational History   Occupation: PRACTICE ADMINISTRATOR    Employer: [employer]   Tobacco Use   Smoking status: Never Smoker   Smokeless tobacco: Never Used  Substance and Sexual Activity   Alcohol use: No   Drug use: No   Sexual activity: Yes    Birth control/protection: Pill    Other Topics Concern   Not on file  Social History Narrative   Not on file   FAMILY HISTORY:  We obtained a detailed, 4-generation family history.  :   Sheila [name] has three daughters, ages 86, 24, and 67.  Sheila [name] has one full brother, age 65, and several maternal and paternal half siblings, none of whom have a cancer history.  Sheila [name]'s mother is 53 and does not have a cancer history.  No maternal family history of cancer was reported.  Sheila [name]'s father passed away at age 19 and did not have cancer.  Sheila [name] had limited information about her paternal family history.    Sheila [name] is unaware of previous family history of genetic testing for hereditary cancer risks. Patient's maternal ancestors are of [place] descent, and paternal ancestors are of [place] descent. There is no reported Ashkenazi Jewish ancestry. There is no known consanguinity.  GENETIC COUNSELING ASSESSMENT: Sheila [name] is a 49 y.o. female with a personal history of DCIS which is somewhat suggestive of a hereditary cancer syndrome and predisposition to cancer given her age of diagnosis and limited paternal family health information. We, therefore, discussed and recommended the following at today's visit.   DISCUSSION: We discussed that 5 - 10% of cancer is hereditary, with most cases of hereditary breast cancer associated with mutations in BRCA1/2.  There are other genes that can be associated with  hereditary breast cancer syndromes.  Type of cancer and level of cancer risk are gene-specific.  We discussed that testing is beneficial for several reasons including knowing how to follow individuals after completing their treatment, identifying whether potential treatment options would be beneficial, and understanding if other family members could be at risk for cancer and allowing them to undergo genetic testing.   We reviewed the characteristics, features and inheritance patterns of hereditary cancer syndromes.  We also discussed genetic testing, including the appropriate family members to test, the process of testing, insurance coverage and turn-around-time for results. We discussed the implications of a negative, positive and/or variant of uncertain significant result. In order to get genetic test results in a timely manner so that Sheila [name] can use these genetic test results for surgical decisions, we recommended Sheila [name] pursue genetic testing for the STAT Breast Cancer Panel.  The STAT Breast cancer panel offered by [facility] includes sequencing and rearrangement analysis for the following 9 genes:  ATM, BRCA1, BRCA2, CDH1, CHEK2, PALB2, PTEN, STK11 and TP53.  Once complete, we recommend Sheila [name] pursue reflex genetic testing to a more comprehensive gene panel.   Sheila [name]  was offered a common hereditary cancer panel (48 genes) and an expanded pan-cancer panel (85 genes). Sheila [name] was informed of the benefits and limitations of each panel, including that expanded pan-cancer panels contain several preliminary evidence genes that do not have clear management guidelines at this point in time.  We also discussed that as the number of genes included on a panel increases, the chances of variants of uncertain significance increases.  After considering the benefits and limitations of each gene panel, Sheila [name]  elected to have a common hereditary cancer panel through [facility].  She is still considering a pan-cancer panel and wishes to speak about this option with her husband.  She will call in the next couple days if she is interested in an expanded pan-cancer panel rather than a common hereditary cancer panel.   The Common Hereditary Cancer Panel offered by [facility] includes sequencing and/or deletion duplication testing of the following 48 genes: APC, ATM, AXIN2, BARD1, BMPR1A, BRCA1, BRCA2, BRIP1, CDH1, CDK4, CDKN2A (p14ARF), CDKN2A (p16INK4a), CHEK2, CTNNA1, DICER1, EPCAM (Deletion/duplication testing  only), GREM1 (promoter region deletion/duplication testing only), KIT, MEN1, MLH1, MSH2, MSH3, MSH6, MUTYH, NBN, NF1, NHTL1, PALB2, PDGFRA, PMS2, POLD1, POLE, PTEN, RAD50, RAD51C, RAD51D, RNF43, SDHB, SDHC, SDHD, SMAD4, SMARCA4. STK11, TP53, TSC1, TSC2, and VHL.  The following genes were evaluated for sequence changes only: SDHA and HOXB13 c.251G>A variant only.  Based on Ms. Widmer's personal history of DCIS and limited paternal family history information, she meets medical criteria for genetic testing. Despite that she meets criteria, she may still have an out of pocket cost. We discussed that if her out of pocket cost for testing is over $100, the laboratory will call and confirm whether she wants to proceed with testing.  If the out of pocket cost of testing is less than $100 she will be billed by the genetic testing laboratory.   PLAN: After considering the risks, benefits, and limitations, Ms. Gass provided informed consent to pursue genetic testing and the blood sample was sent to [facility] for analysis of the STAT+Common Hereditary Cancers Panel. Results should be available within approximately 1-2 weeks' time, at which point they will be disclosed by telephone to Ms. Butterbaugh, as will any additional recommendations warranted by these results. Ms. Demos will receive a summary of her genetic counseling visit  and a copy of her results once available. This information will also be available in Epic.   Lastly, we encouraged Ms. Wainer to remain in contact with cancer genetics annually so that we can continuously update the family history and inform her of any changes in cancer genetics and testing that may be of benefit for this family.   Ms. Angelillo's questions were answered to her satisfaction today. Our contact information was provided should additional questions or concerns arise. Thank you for the referral and allowing [place] to share in the care of your patient.   Consuela Widener M. Joette Catching,  [place], [place] Film/video [email or web address] (P) [phone]  The patient was seen for a total of 20 minutes in face-to-face genetic counseling.  This patient was discussed with Drs. Magrinat, Lindi Adie and/or Burr Medico who agrees with the above.    _______________________________________________________________________ For Office Staff:  Number of people involved in session: 1 Was an Intern/ student involved with case: no

## 2020-07-19 ENCOUNTER — Encounter: Payer: Self-pay | Admitting: *Deleted

## 2020-07-19 ENCOUNTER — Encounter: Payer: 59 | Admitting: Genetic Counselor

## 2020-07-19 ENCOUNTER — Encounter: Payer: Self-pay | Admitting: General Practice

## 2020-07-19 NOTE — Progress Notes (Signed)
[place] Psychosocial Distress Screening Spiritual Care  Left voicemail for [facility] following Breast Multidisciplinary Clinic to introduce [facility] team/resources, reviewing distress screen per protocol.  The patient scored a 1 on the Psychosocial Distress Thermometer which indicates mild distress.   ONCBCN DISTRESS SCREENING 07/19/2020  Screening Type Initial Screening  Distress experienced in past week (1-10) 1  Emotional problem type Nervousness/Anxiety   Zyanne had to get to work near the end of [facility], so I phoned this morning to follow up, leaving voicemail with encouragement to return call.  Follow up needed: No.   Chaplain Lorrin Jackson, [place], [facility] Pager [phone] Voicemail [phone]

## 2020-07-20 ENCOUNTER — Telehealth: Payer: Self-pay | Admitting: Oncology

## 2020-07-20 NOTE — Telephone Encounter (Signed)
Scheduled appts per 11/17 los. Left voicemail with appt date and time.

## 2020-07-23 ENCOUNTER — Telehealth: Payer: Self-pay | Admitting: *Deleted

## 2020-07-23 ENCOUNTER — Other Ambulatory Visit: Payer: Self-pay | Admitting: General Surgery

## 2020-07-23 ENCOUNTER — Encounter: Payer: Self-pay | Admitting: *Deleted

## 2020-07-23 DIAGNOSIS — C50412 Malignant neoplasm of upper-outer quadrant of left female breast: Secondary | ICD-10-CM

## 2020-07-23 NOTE — Telephone Encounter (Signed)
Spoke to pt concerning [place] from 11.17.21. Denies questions or concerns regarding dx or treatment care plan. Encourage pt to call with needs. Received verbal understanding.

## 2020-07-25 NOTE — Progress Notes (Signed)
Nutrition  Patient identified by attending Breast Clinic on 07/18/20.  Patient was given nutrition packet with RD contact information by nurse navigator.   Chart reviewed.   49 year old female with new diagnosis of left breast cancer.  Planning lumpectomy, radiation and antiestrogens.    Ht: 64 inches Wt: 174 lb BMI: 29  Patient currently does not meet criteria for malnutrition.  Please consult RD nutritional status changes.   Wende Longstreth B. Zenia Resides, [place], [place] Registered Dietitian [phone] (mobile)

## 2020-07-27 ENCOUNTER — Telehealth: Payer: Self-pay | Admitting: Genetic Counselor

## 2020-07-27 NOTE — Telephone Encounter (Signed)
Contacted patient in attempt to disclose results of genetic testing.  LVM with contact information requesting a call back.  

## 2020-07-30 ENCOUNTER — Encounter (HOSPITAL_COMMUNITY): Payer: Self-pay | Admitting: General Surgery

## 2020-07-30 ENCOUNTER — Other Ambulatory Visit: Payer: Self-pay | Admitting: General Surgery

## 2020-07-30 ENCOUNTER — Other Ambulatory Visit: Payer: Self-pay

## 2020-07-30 ENCOUNTER — Other Ambulatory Visit (HOSPITAL_COMMUNITY)
Admission: RE | Admit: 2020-07-30 | Discharge: 2020-07-30 | Disposition: A | Discharge status: Home / Self Care | Payer: 59 | Source: Ambulatory Visit | Attending: General Surgery | Admitting: General Surgery

## 2020-07-30 DIAGNOSIS — Z170 Estrogen receptor positive status [ER+]: Secondary | ICD-10-CM

## 2020-07-30 DIAGNOSIS — Z01812 Encounter for preprocedural laboratory examination: Secondary | ICD-10-CM | POA: Diagnosis not present

## 2020-07-30 DIAGNOSIS — Z20822 Contact with and (suspected) exposure to covid-19: Secondary | ICD-10-CM | POA: Insufficient documentation

## 2020-07-30 NOTE — H&P (Signed)
  Sheila [name] Appointment: 07/23/2020 11:30 AM Location: [facility] Office Patient #: [id number] DOB: [birth year] Undefined / Language: Sheila [name] / Race: Black or African American Female   History of Present Illness Sheila Klein MD; 07/23/2020 12:02 PM) The patient is a 49 year old female who presents with breast cancer.Pt is a 49 yo F who presented with a red nipple on the left. Appointment was requested, but was a ways off, so she got dx mammogram. 2 cm of calcifications were seen in the upper outer quadrant. Core needle biopsy showed grade 2-3 DCIS with necrosis and calcification. This was ER+/PR+. The nipple has been red for around 4-6 months or so. She thought it was a burn because she fell asleep with a heating bad on. She put on antibiotic ointment and it got significantly better, however it did not resolve. She denies any palpable masses. She has no family history of breast cancer or other cancers.   She had menarche at age 34 and has 3 children with the first at age 67. She has not yet had a colonoscopy or bone density, but is up to date with pap smear. She has been on birth control pills for around 10 years.   Pt here for punch biopsy of nipple.   images and reports reviewed. [facility]  pathology 07/12/2020 Breast, left, needle core biopsy, upper outer quadrant, 9 cmfn, post depth - DUCTAL CARCINOMA IN SITU, INTERMEDIATE TO HIGH-GRADE, WITH NECROSIS AND CALCIFICATIONS. SEE NOTE - FOCALLY SUSPICIOUS FOR MICROINVASION Diagnosis Note DCIS measures 0.6 cm in greatest linear dimension. Estrogen Receptor: 70%, POSITIVE, MODERATE-STRONG STAINING INTENSITY Progesterone Receptor: 5%, POSITIVE, MODERATE-STRONG STAINING INTENSITY   labs: CMET and CBC essentially normal 07/18/2020   Allergies Emeline Gins, CMA; 07/23/2020 11:39 AM) No Known Allergies  [07/16/2020]: Allergies Reconciled   Medication History Emeline Gins, [place]; 07/23/2020 11:40 AM) Multiple  Vitamin (1 (one) Oral) Active. Aleve (220MG  Tablet, Oral) Active. Tylenol (500MG  Capsule, Oral) Active. Medications Reconciled  Vitals Emeline Gins CMA; 07/23/2020 11:39 AM) 07/23/2020 11:38 AM Weight: 171 lb Height: 64in Body Surface Area: 1.83 m Body Mass Index: 29.35 kg/m  Temp.: 97.16F  Pulse: 81 (Regular)  BP: 116/78(Sitting, Left Arm, Standard)       Physical Exam Sheila Klein MD; 07/23/2020 12:02 PM) Breast Note: left nipple remains red and inflamed. punch biopsy performed.     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  MALIGNANT NEOPLASM OF UPPER-OUTER QUADRANT OF LEFT BREAST IN FEMALE, ESTROGEN RECEPTOR POSITIVE (C50.412) Plan seed localized lumpectomy.  Reviewed risks and questions answered.     Signed electronically by Sheila Klein, MD (07/23/2020 12:04 PM)

## 2020-07-30 NOTE — Progress Notes (Signed)
PCP - Dr Willey Blade  Cardiologist - n/a OB-GYN - Dr Lahoma Crocker Oncology - Dr Lurline Del  Chest x-ray - n/a EKG - n/a Stress Test - n/a ECHO - n/a Cardiac Cath - n/a  ERAS: Clears til 12:30 pm DOS.  STOP now taking any Aspirin (unless otherwise instructed by your surgeon), Aleve, Naproxen, Ibuprofen, Motrin, Advil, Goody's, BC's, all herbal medications, fish oil, and all vitamins.   Coronavirus Screening Covid test scheduled on 07/30/20 Do you have any of the following symptoms:  Cough yes/no: No Fever (>100.9F)  yes/no: No Runny nose yes/no: No Sore throat yes/no: No Difficulty breathing/shortness of breath  yes/no: No  Have you traveled in the last 14 days and where? yes/no: No  Patient verbalized understanding of instructions that were given via phone.

## 2020-07-31 ENCOUNTER — Other Ambulatory Visit: Payer: Self-pay

## 2020-07-31 ENCOUNTER — Ambulatory Visit (HOSPITAL_COMMUNITY): Payer: 59 | Admitting: Anesthesiology

## 2020-07-31 ENCOUNTER — Encounter (HOSPITAL_COMMUNITY)
Admission: RE | Disposition: A | Discharge status: Home / Self Care | Payer: Self-pay | Source: Home / Self Care | Attending: General Surgery

## 2020-07-31 ENCOUNTER — Ambulatory Visit (HOSPITAL_COMMUNITY)
Admission: RE | Admit: 2020-07-31 | Discharge: 2020-07-31 | Disposition: A | Discharge status: Home / Self Care | Payer: 59 | Attending: General Surgery | Admitting: General Surgery

## 2020-07-31 ENCOUNTER — Encounter (HOSPITAL_COMMUNITY): Payer: Self-pay | Admitting: General Surgery

## 2020-07-31 ENCOUNTER — Encounter: Payer: Self-pay | Admitting: *Deleted

## 2020-07-31 DIAGNOSIS — Z170 Estrogen receptor positive status [ER+]: Secondary | ICD-10-CM | POA: Diagnosis not present

## 2020-07-31 DIAGNOSIS — C50412 Malignant neoplasm of upper-outer quadrant of left female breast: Secondary | ICD-10-CM | POA: Diagnosis present

## 2020-07-31 DIAGNOSIS — D0512 Intraductal carcinoma in situ of left breast: Secondary | ICD-10-CM | POA: Insufficient documentation

## 2020-07-31 HISTORY — DX: Unspecified asthma, uncomplicated: J45.909

## 2020-07-31 HISTORY — PX: BREAST LUMPECTOMY WITH RADIOACTIVE SEED LOCALIZATION: SHX6424

## 2020-07-31 LAB — POCT PREGNANCY, URINE: Preg Test, Ur: NEGATIVE

## 2020-07-31 LAB — SARS CORONAVIRUS 2 (TAT 6-24 HRS): SARS Coronavirus 2: NEGATIVE

## 2020-07-31 SURGERY — BREAST LUMPECTOMY WITH RADIOACTIVE SEED LOCALIZATION
Anesthesia: General | Site: Breast | Laterality: Left

## 2020-07-31 MED ORDER — DEXAMETHASONE SODIUM PHOSPHATE 10 MG/ML IJ SOLN
INTRAMUSCULAR | Status: AC
  Filled 2020-07-31: qty 1

## 2020-07-31 MED ORDER — LACTATED RINGERS IV SOLN: INTRAVENOUS | Status: DC | PRN

## 2020-07-31 MED ORDER — OXYCODONE HCL 5 MG/5ML PO SOLN: 5.0000 mg | Freq: Once | ORAL | Status: DC | PRN

## 2020-07-31 MED ORDER — BUPIVACAINE HCL (PF) 0.25 % IJ SOLN
INTRAMUSCULAR | Status: AC
  Filled 2020-07-31: qty 20

## 2020-07-31 MED ORDER — LIDOCAINE HCL (PF) 2 % IJ SOLN
INTRAMUSCULAR | Status: AC
  Filled 2020-07-31: qty 5

## 2020-07-31 MED ORDER — BUPIVACAINE-EPINEPHRINE (PF) 0.25% -1:200000 IJ SOLN
INTRAMUSCULAR | Status: AC
  Filled 2020-07-31: qty 20

## 2020-07-31 MED ORDER — MIDAZOLAM HCL 2 MG/2ML IJ SOLN: 0.5000 mg | Freq: Once | INTRAMUSCULAR | Status: DC | PRN

## 2020-07-31 MED ORDER — OXYCODONE HCL 5 MG PO TABS: 5.0000 mg | ORAL_TABLET | Freq: Once | ORAL | Status: DC | PRN

## 2020-07-31 MED ORDER — MIDAZOLAM HCL 2 MG/2ML IJ SOLN
INTRAMUSCULAR | Status: DC | PRN
  Administered 2020-07-31: 2 mg via INTRAVENOUS

## 2020-07-31 MED ORDER — PHENYLEPHRINE HCL (PRESSORS) 10 MG/ML IV SOLN
INTRAVENOUS | Status: DC | PRN
  Administered 2020-07-31: 120 ug via INTRAVENOUS
  Administered 2020-07-31 (×2): 80 ug via INTRAVENOUS

## 2020-07-31 MED ORDER — PROPOFOL 10 MG/ML IV BOLUS
INTRAVENOUS | Status: AC
  Filled 2020-07-31: qty 20

## 2020-07-31 MED ORDER — EPHEDRINE SULFATE 50 MG/ML IJ SOLN
INTRAMUSCULAR | Status: DC | PRN
  Administered 2020-07-31: 5 mg via INTRAVENOUS

## 2020-07-31 MED ORDER — EPHEDRINE 5 MG/ML INJ
INTRAVENOUS | Status: AC
  Filled 2020-07-31: qty 10

## 2020-07-31 MED ORDER — LIDOCAINE HCL (CARDIAC) PF 100 MG/5ML IV SOSY
PREFILLED_SYRINGE | INTRAVENOUS | Status: DC | PRN
  Administered 2020-07-31: 60 mg via INTRATRACHEAL

## 2020-07-31 MED ORDER — ACETAMINOPHEN 500 MG PO TABS
1000.0000 mg | ORAL_TABLET | Freq: Once | ORAL | Status: DC
  Filled 2020-07-31: qty 2

## 2020-07-31 MED ORDER — CEFAZOLIN SODIUM-DEXTROSE 2-4 GM/100ML-% IV SOLN
2.0000 g | INTRAVENOUS | Status: AC
  Administered 2020-07-31: 2 g via INTRAVENOUS
  Filled 2020-07-31: qty 100

## 2020-07-31 MED ORDER — CHLORHEXIDINE GLUCONATE CLOTH 2 % EX PADS: 6.0000 | MEDICATED_PAD | Freq: Once | CUTANEOUS | Status: DC

## 2020-07-31 MED ORDER — HYDROMORPHONE HCL 1 MG/ML IJ SOLN: 0.2500 mg | INTRAMUSCULAR | Status: DC | PRN

## 2020-07-31 MED ORDER — MEPERIDINE HCL 25 MG/ML IJ SOLN: 6.2500 mg | INTRAMUSCULAR | Status: DC | PRN

## 2020-07-31 MED ORDER — CHLORHEXIDINE GLUCONATE 0.12 % MT SOLN
15.0000 mL | Freq: Once | OROMUCOSAL | Status: AC
  Administered 2020-07-31: 15 mL via OROMUCOSAL
  Filled 2020-07-31: qty 15

## 2020-07-31 MED ORDER — LIDOCAINE HCL 1 % IJ SOLN
INTRAMUSCULAR | Status: AC
  Filled 2020-07-31: qty 20

## 2020-07-31 MED ORDER — OXYCODONE HCL 5 MG PO TABS: 2.5000 mg | ORAL_TABLET | Freq: Four times a day (QID) | ORAL | 0 refills | Status: DC | PRN

## 2020-07-31 MED ORDER — SCOPOLAMINE 1 MG/3DAYS TD PT72
1.0000 | MEDICATED_PATCH | TRANSDERMAL | Status: DC
  Administered 2020-07-31: 1.5 mg via TRANSDERMAL
  Filled 2020-07-31: qty 1

## 2020-07-31 MED ORDER — LIDOCAINE HCL 1 % IJ SOLN
INTRAMUSCULAR | Status: DC | PRN
  Administered 2020-07-31: 30 mL via INTRAMUSCULAR

## 2020-07-31 MED ORDER — PHENYLEPHRINE 40 MCG/ML (10ML) SYRINGE FOR IV PUSH (FOR BLOOD PRESSURE SUPPORT)
PREFILLED_SYRINGE | INTRAVENOUS | Status: AC
  Filled 2020-07-31: qty 10

## 2020-07-31 MED ORDER — MIDAZOLAM HCL 2 MG/2ML IJ SOLN
INTRAMUSCULAR | Status: AC
  Filled 2020-07-31: qty 2

## 2020-07-31 MED ORDER — ONDANSETRON HCL 4 MG/2ML IJ SOLN
INTRAMUSCULAR | Status: DC | PRN
  Administered 2020-07-31: 4 mg via INTRAVENOUS

## 2020-07-31 MED ORDER — ONDANSETRON HCL 4 MG/2ML IJ SOLN
INTRAMUSCULAR | Status: AC
  Filled 2020-07-31: qty 2

## 2020-07-31 MED ORDER — ORAL CARE MOUTH RINSE: 15.0000 mL | Freq: Once | OROMUCOSAL | Status: AC

## 2020-07-31 MED ORDER — DEXAMETHASONE SODIUM PHOSPHATE 10 MG/ML IJ SOLN
INTRAMUSCULAR | Status: DC | PRN
  Administered 2020-07-31: 8 mg via INTRAVENOUS

## 2020-07-31 MED ORDER — ACETAMINOPHEN 500 MG PO TABS
1000.0000 mg | ORAL_TABLET | ORAL | Status: AC
  Administered 2020-07-31: 1000 mg via ORAL

## 2020-07-31 MED ORDER — PROPOFOL 10 MG/ML IV BOLUS
INTRAVENOUS | Status: DC | PRN
  Administered 2020-07-31: 150 mg via INTRAVENOUS

## 2020-07-31 MED ORDER — LIDOCAINE HCL 1 % IJ SOLN
INTRAMUSCULAR | Status: DC | PRN
  Administered 2020-07-31: 14 mL via INTRAMUSCULAR

## 2020-07-31 MED ORDER — 0.9 % SODIUM CHLORIDE (POUR BTL) OPTIME
TOPICAL | Status: DC | PRN
  Administered 2020-07-31: 1000 mL

## 2020-07-31 MED ORDER — LACTATED RINGERS IV SOLN: INTRAVENOUS | Status: DC

## 2020-07-31 MED ORDER — PROMETHAZINE HCL 25 MG/ML IJ SOLN: 6.2500 mg | INTRAMUSCULAR | Status: DC | PRN

## 2020-07-31 MED ORDER — FENTANYL CITRATE (PF) 250 MCG/5ML IJ SOLN
INTRAMUSCULAR | Status: DC | PRN
  Administered 2020-07-31 (×3): 50 ug via INTRAVENOUS

## 2020-07-31 MED ORDER — FENTANYL CITRATE (PF) 250 MCG/5ML IJ SOLN
INTRAMUSCULAR | Status: AC
  Filled 2020-07-31: qty 5

## 2020-07-31 SURGICAL SUPPLY — 50 items
BINDER BREAST LRG (GAUZE/BANDAGES/DRESSINGS) IMPLANT
BINDER BREAST XLRG (GAUZE/BANDAGES/DRESSINGS) ×3 IMPLANT
BLADE SURG 10 STRL SS (BLADE) ×3 IMPLANT
CANISTER SUCT 3000ML PPV (MISCELLANEOUS) IMPLANT
CHLORAPREP W/TINT 26 (MISCELLANEOUS) ×3 IMPLANT
CLIP VESOCCLUDE LG 6/CT (CLIP) ×3 IMPLANT
CLIP VESOCCLUDE MED 6/CT (CLIP) ×3 IMPLANT
CLOSURE WOUND 1/2 X4 (GAUZE/BANDAGES/DRESSINGS) ×1
COVER PROBE W GEL 5X96 (DRAPES) ×3 IMPLANT
COVER SURGICAL LIGHT HANDLE (MISCELLANEOUS) ×3 IMPLANT
COVER WAND RF STERILE (DRAPES) ×3 IMPLANT
DECANTER SPIKE VIAL GLASS SM (MISCELLANEOUS) ×3 IMPLANT
DERMABOND ADVANCED (GAUZE/BANDAGES/DRESSINGS) ×2
DERMABOND ADVANCED .7 DNX12 (GAUZE/BANDAGES/DRESSINGS) ×1 IMPLANT
DEVICE DUBIN SPECIMEN MAMMOGRA (MISCELLANEOUS) ×3 IMPLANT
DRAPE CHEST BREAST 15X10 FENES (DRAPES) ×3 IMPLANT
DRSG PAD ABDOMINAL 8X10 ST (GAUZE/BANDAGES/DRESSINGS) ×3 IMPLANT
DRSG TEGADERM 2-3/8X2-3/4 SM (GAUZE/BANDAGES/DRESSINGS) ×3 IMPLANT
ELECT COATED BLADE 2.86 ST (ELECTRODE) ×3 IMPLANT
ELECT REM PT RETURN 9FT ADLT (ELECTROSURGICAL) ×3
ELECTRODE REM PT RTRN 9FT ADLT (ELECTROSURGICAL) ×1 IMPLANT
GAUZE 4X4 16PLY RFD (DISPOSABLE) ×3 IMPLANT
GAUZE SPONGE 4X4 12PLY STRL LF (GAUZE/BANDAGES/DRESSINGS) IMPLANT
GLOVE BIO SURGEON STRL SZ 6 (GLOVE) ×3 IMPLANT
GLOVE BIOGEL PI IND STRL 6.5 (GLOVE) ×2 IMPLANT
GLOVE BIOGEL PI IND STRL 7.5 (GLOVE) ×2 IMPLANT
GLOVE BIOGEL PI INDICATOR 6.5 (GLOVE) ×4
GLOVE BIOGEL PI INDICATOR 7.5 (GLOVE) ×4
GLOVE INDICATOR 6.5 STRL GRN (GLOVE) ×3 IMPLANT
GLOVE SURG SS PI 6.5 STRL IVOR (GLOVE) ×3 IMPLANT
GOWN STRL REUS W/ TWL LRG LVL3 (GOWN DISPOSABLE) ×2 IMPLANT
GOWN STRL REUS W/TWL 2XL LVL3 (GOWN DISPOSABLE) ×3 IMPLANT
GOWN STRL REUS W/TWL LRG LVL3 (GOWN DISPOSABLE) ×6
KIT BASIN OR (CUSTOM PROCEDURE TRAY) ×3 IMPLANT
KIT MARKER MARGIN INK (KITS) ×3 IMPLANT
LIGHT WAVEGUIDE WIDE FLAT (MISCELLANEOUS) ×3 IMPLANT
NEEDLE HYPO 25GX1X1/2 BEV (NEEDLE) ×3 IMPLANT
NS IRRIG 1000ML POUR BTL (IV SOLUTION) IMPLANT
PACK GENERAL/GYN (CUSTOM PROCEDURE TRAY) ×3 IMPLANT
PAD ABD 8X10 STRL (GAUZE/BANDAGES/DRESSINGS) ×3 IMPLANT
STRIP CLOSURE SKIN 1/2X4 (GAUZE/BANDAGES/DRESSINGS) ×2 IMPLANT
SUT MNCRL AB 4-0 PS2 18 (SUTURE) ×3 IMPLANT
SUT SILK 2 0 SH (SUTURE) IMPLANT
SUT VIC AB 2-0 SH 27 (SUTURE) ×3
SUT VIC AB 2-0 SH 27XBRD (SUTURE) ×1 IMPLANT
SUT VIC AB 3-0 SH 27 (SUTURE) ×3
SUT VIC AB 3-0 SH 27X BRD (SUTURE) ×1 IMPLANT
SYR CONTROL 10ML LL (SYRINGE) ×3 IMPLANT
TOWEL GREEN STERILE (TOWEL DISPOSABLE) ×6 IMPLANT
TOWEL GREEN STERILE FF (TOWEL DISPOSABLE) ×3 IMPLANT

## 2020-07-31 NOTE — Anesthesia Procedure Notes (Signed)
Procedure Name: LMA Insertion Date/Time: 07/31/2020 3:34 PM Performed by: Kathryne Hitch, CRNA Pre-anesthesia Checklist: Patient identified, Emergency Drugs available, Suction available and Patient being monitored Patient Re-evaluated:Patient Re-evaluated prior to induction Oxygen Delivery Method: Circle system utilized Preoxygenation: Pre-oxygenation with 100% oxygen Induction Type: IV induction Ventilation: Mask ventilation without difficulty LMA: LMA inserted LMA Size: 4.0 Number of attempts: 1 Placement Confirmation: positive ETCO2 and breath sounds checked- equal and bilateral Tube secured with: Tape Dental Injury: Teeth and Oropharynx as per pre-operative assessment

## 2020-07-31 NOTE — Op Note (Signed)
Left Breast Radioactive seed localized lumpectomy  Indications: This patient presents with history of left breast cancer, upper outer quadrant, cTis, Receptors +/+  Pre-operative Diagnosis: left breast cancer  Post-operative Diagnosis: Same  Surgeon: [name]   Anesthesia: General endotracheal anesthesia  ASA Class: 2  Procedure Details  The patient was seen in the Holding Room. The risks, benefits, complications, treatment options, and expected outcomes were discussed with the patient. The possibilities of bleeding, infection, the need for additional procedures, failure to diagnose a condition, and creating a complication requiring other procedures or operations were discussed with the patient. The patient concurred with the proposed plan, giving informed consent.  The site of surgery properly noted/marked. The patient was taken to Operating Room # 2, identified, and the procedure verified as left breast seed localized lumpectomy.  The left breast and chest were prepped and draped in standard fashion. A lateral transverse incision was made near the previously placed radioactive seed.  Dissection was carried down around the point of maximum signal intensity. The cautery was used to perform the dissection.   The specimen was inked with the margin marker paint kit.    Specimen radiography confirmed inclusion of the mammographic lesion, the clip, and the seed.  The background signal in the breast was zero.  Hemostasis was achieved with cautery.  The cavity was marked with clips on each border other than the anterior border.  The wound was irrigated and closed with 3-0 vicryl interrupted deep dermal sutures and 4-0 monocryl running subcuticular suture.      Sterile dressings were applied. At the end of the operation, all sponge, instrument, and needle counts were correct.   Findings: Seed, clip in specimen.  posterior margin is pectoralis.   Estimated Blood Loss:  min         Specimens:  left breast tissue with seed         Complications:  None; patient tolerated the procedure well.         Disposition: PACU - hemodynamically stable.         Condition: stable

## 2020-07-31 NOTE — Transfer of Care (Signed)
Immediate Anesthesia Transfer of Care Note  Patient: Sheila [name]  Procedure(s) Performed: LEFT BREAST LUMPECTOMY WITH RADIOACTIVE SEED LOCALIZATION (Left Breast)  Patient Location: PACU  Anesthesia Type:General  Level of Consciousness: drowsy  Airway & Oxygen Therapy: Patient Spontanous Breathing and Patient connected to face mask oxygen  Post-op Assessment: Report given to RN and Post -op Vital signs reviewed and stable  Post vital signs: Reviewed and stable  Last Vitals:  Vitals Value Taken Time  BP 127/81   Temp    Pulse    Resp    SpO2 100     Last Pain:  Vitals:   07/31/20 1401  TempSrc:   PainSc: 0-No pain      Patients Stated Pain Goal: 4 (50/01/64 2903)  Complications: No complications documented.

## 2020-07-31 NOTE — Interval H&P Note (Signed)
History and Physical Interval Note:  07/31/2020 2:47 PM  Sheila [name]  has presented today for surgery, with the diagnosis of LEFT BREAST CANCER.  The various methods of treatment have been discussed with the patient and family. After consideration of risks, benefits and other options for treatment, the patient has consented to  Procedure(s) with comments: LEFT BREAST LUMPECTOMY WITH RADIOACTIVE SEED LOCALIZATION (Left) - RNFA as a surgical intervention.  The patient's history has been reviewed, patient examined, no change in status, stable for surgery.  I have reviewed the patient's chart and labs.  Questions were answered to the patient's satisfaction.     [name]

## 2020-07-31 NOTE — Anesthesia Preprocedure Evaluation (Addendum)
Anesthesia Evaluation  Patient identified by MRN, date of birth, ID band Patient awake    Reviewed: Allergy & Precautions, NPO status , Patient's Chart, lab work & pertinent test results  History of Anesthesia Complications Negative for: history of anesthetic complications  Airway Mallampati: I  TM Distance: >3 FB Neck ROM: Full    Dental  (+) Chipped, Dental Advisory Given   Pulmonary asthma (childhood, does not use inhalers) ,  07/30/2020 SARS coronavirus NEG   breath sounds clear to auscultation       Cardiovascular negative cardio ROS   Rhythm:Regular Rate:Normal     Neuro/Psych negative neurological ROS     GI/Hepatic negative GI ROS, Neg liver ROS,   Endo/Other  negative endocrine ROS  Renal/GU negative Renal ROS     Musculoskeletal   Abdominal   Peds  Hematology  (+) Blood dyscrasia (Hb 10.5), anemia ,   Anesthesia Other Findings Breast cancer  Reproductive/Obstetrics LMP presently                            Anesthesia Physical Anesthesia Plan  ASA: II  Anesthesia Plan: General   Post-op Pain Management:    Induction: Intravenous  PONV Risk Score and Plan: 3 and Ondansetron, Dexamethasone, Treatment may vary due to age or medical condition and Scopolamine patch - Pre-op  Airway Management Planned: LMA and Mask  Additional Equipment:   Intra-op Plan:   Post-operative Plan: Extubation in OR  Informed Consent: I have reviewed the patients History and Physical, chart, labs and discussed the procedure including the risks, benefits and alternatives for the proposed anesthesia with the patient or authorized representative who has indicated his/her understanding and acceptance.     Dental advisory given  Plan Discussed with: CRNA and Surgeon  Anesthesia Plan Comments: (Lab Results      Component                Value               Date                      WBC                       7.6                 07/18/2020                HGB                      10.5 (L)            07/18/2020                HCT                      34.8 (L)            07/18/2020                MCV                      73.0 (L)            07/18/2020                PLT  380                 07/18/2020           Lab Results      Component                Value               Date                      NA                       137                 07/18/2020                K                        3.8                 07/18/2020                CO2                      25                  07/18/2020                GLUCOSE                  82                  07/18/2020                BUN                      8                   07/18/2020                CREATININE               0.81                07/18/2020                CALCIUM                  9.1                 07/18/2020                GFRNONAA                 >60                 07/18/2020          )      Anesthesia Quick Evaluation

## 2020-07-31 NOTE — Discharge Instructions (Addendum)
[facility] °Office Phone Number [phone] ° °BREAST BIOPSY/ PARTIAL MASTECTOMY: POST OP INSTRUCTIONS ° °Always review your discharge instruction sheet given to you by the facility where your surgery was performed. ° °IF YOU HAVE DISABILITY OR FAMILY LEAVE FORMS, YOU MUST BRING THEM TO THE OFFICE FOR PROCESSING.  DO NOT GIVE THEM TO YOUR DOCTOR. ° °1. A prescription for pain medication may be given to you upon discharge.  Take your pain medication as prescribed, if needed.  If narcotic pain medicine is not needed, then you may take acetaminophen (Tylenol) or ibuprofen (Advil) as needed. °2. Take your usually prescribed medications unless otherwise directed °3. If you need a refill on your pain medication, please contact your pharmacy.  They will contact our office to request authorization.  Prescriptions will not be filled after 5pm or on week-ends. °4. You should eat very light the first 24 hours after surgery, such as soup, crackers, pudding, etc.  Resume your normal diet the day after surgery. °5. Most patients will experience some swelling and bruising in the breast.  Ice packs and a good support bra will help.  Swelling and bruising can take several days to resolve.  °6. It is common to experience some constipation if taking pain medication after surgery.  Increasing fluid intake and taking a stool softener will usually help or prevent this problem from occurring.  A mild laxative (Milk of Magnesia or Miralax) should be taken according to package directions if there are no bowel movements after 48 hours. °7. Unless discharge instructions indicate otherwise, you may remove your bandages 48 hours after surgery, and you may shower at that time.  You may have steri-strips (small skin tapes) in place directly over the incision.  These strips should be left on the skin for 7-10 days.   Any sutures or staples will be removed at the office during your follow-up visit. °8. ACTIVITIES:  You may resume  regular daily activities (gradually increasing) beginning the next day.  Wearing a good support bra or sports bra (or the breast binder) minimizes pain and swelling.  You may have sexual intercourse when it is comfortable. °a. You may drive when you no longer are taking prescription pain medication, you can comfortably wear a seatbelt, and you can safely maneuver your car and apply brakes. °b. RETURN TO WORK:  __________1 week_______________ °9. You should see your doctor in the office for a follow-up appointment approximately two weeks after your surgery.  Your doctor’s nurse will typically make your follow-up appointment when she calls you with your pathology report.  Expect your pathology report 2-3 business days after your surgery.  You may call to check if you do not hear from us after three days. ° ° °WHEN TO CALL YOUR DOCTOR: °1. Fever over 101.0 °2. Nausea and/or vomiting. °3. Extreme swelling or bruising. °4. Continued bleeding from incision. °5. Increased pain, redness, or drainage from the incision. ° °The clinic staff is available to answer your questions during regular business hours.  Please don’t hesitate to call and ask to speak to one of the nurses for clinical concerns.  If you have a medical emergency, go to the nearest emergency room or call 911.  A surgeon from [facility] is always on call at the hospital. ° °For further questions, please visit [email or web address]  ° °

## 2020-08-01 ENCOUNTER — Encounter (HOSPITAL_COMMUNITY): Payer: Self-pay | Admitting: General Surgery

## 2020-08-01 NOTE — Anesthesia Postprocedure Evaluation (Signed)
Anesthesia Post Note  Patient: Sheila [name]  Procedure(s) Performed: LEFT BREAST LUMPECTOMY WITH RADIOACTIVE SEED LOCALIZATION (Left Breast)     Patient location during evaluation: PACU Anesthesia Type: General Level of consciousness: awake and alert Pain management: pain level controlled Vital Signs Assessment: post-procedure vital signs reviewed and stable Respiratory status: spontaneous breathing, nonlabored ventilation, respiratory function stable and patient connected to nasal cannula oxygen Cardiovascular status: blood pressure returned to baseline and stable Postop Assessment: no apparent nausea or vomiting Anesthetic complications: no   No complications documented.  Last Vitals:  Vitals:   07/31/20 1715 07/31/20 1730  BP: 115/74 121/75  Pulse: 70 73  Resp: 13 11  Temp:    SpO2: 100% 100%    Last Pain:  Vitals:   07/31/20 1730  TempSrc:   PainSc: 4                  Lindzee Gouge P Ettie Krontz

## 2020-08-03 NOTE — Telephone Encounter (Signed)
Contacted patient in attempt to disclose results of genetic testing.  Unable LVM due to full mailbox.

## 2020-08-06 ENCOUNTER — Encounter: Payer: Self-pay | Admitting: *Deleted

## 2020-08-06 LAB — SURGICAL PATHOLOGY

## 2020-08-07 ENCOUNTER — Telehealth: Payer: Self-pay | Admitting: General Surgery

## 2020-08-07 NOTE — Telephone Encounter (Signed)
Called pt phone and got VM.  Called and spoke to husband, Dr. Jeanie Cooks about pathology.  He will discuss with patient, but leaning toward going straight to radiation.

## 2020-08-08 ENCOUNTER — Encounter: Payer: Self-pay | Admitting: Genetic Counselor

## 2020-08-08 DIAGNOSIS — Z1379 Encounter for other screening for genetic and chromosomal anomalies: Secondary | ICD-10-CM | POA: Insufficient documentation

## 2020-08-08 NOTE — Telephone Encounter (Signed)
Contacted patient in attempt to disclose results of genetic testing.  Unable to LVM due to full mailbox.  Fourth attempt.  Letter sent requesting call to disclose results.

## 2020-09-04 NOTE — Progress Notes (Signed)
Location of Breast Cancer: Malignant neoplasm of upper-outer quadrant of LEFT breast, estrogen receptor positive  Histology per Pathology Report:  07/31/2020 FINAL MICROSCOPIC DIAGNOSIS:  A. BREAST, LEFT, LUMPECTOMY:  - Ductal carcinoma in situ, high grade, involving adenosis  - Margins uninvolved by carcinoma (<0.1 cm; anterior margin)  - Previous biopsy site changes present  Receptor Status: ER(70%), PR (5%)  Did patient present with symptoms (if so, please note symptoms) or was this found on screening mammography?:  Patient noted changes in her left nipple sometime around May 2021. She underwent routine  bilateral diagnostic mammography with tomography at [facility] on 06/15/2020 showing: breast density category B; 2 cm grouped calcifications in upper-outer left breast  Past/Anticipated interventions by surgeon, if any: 07/31/2020 Dr. Almond Lint Left breast radioactive seed localized lumpectomy  Past/Anticipated interventions by medical oncology, if any:  Under care of Dr. 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 --F/U scheduled on 09/24/2020  Lymphedema issues, if any:  Patient denies. She did see her surgeon earlier in the week and was told she had "extra fluid" in the breast, but that her body would absorb it over time    Pain issues, if any:  Patient reports mild discomfort at surgical site   SAFETY ISSUES:  Prior radiation? No  Pacemaker/ICD? No  Possible current pregnancy? No  Is the patient on methotrexate? No  Current Complaints / other details:   Patient has received both Pfizer COVID vaccines

## 2020-09-05 ENCOUNTER — Ambulatory Visit
Admission: RE | Admit: 2020-09-05 | Discharge: 2020-09-05 | Disposition: A | Discharge status: Home / Self Care | Payer: 59 | Source: Ambulatory Visit | Attending: Radiation Oncology | Admitting: Radiation Oncology

## 2020-09-05 ENCOUNTER — Encounter: Payer: Self-pay | Admitting: Radiation Oncology

## 2020-09-05 ENCOUNTER — Other Ambulatory Visit: Payer: Self-pay

## 2020-09-05 ENCOUNTER — Inpatient Hospital Stay: Payer: 59 | Attending: Oncology

## 2020-09-05 DIAGNOSIS — Z23 Encounter for immunization: Secondary | ICD-10-CM | POA: Diagnosis not present

## 2020-09-05 DIAGNOSIS — Z170 Estrogen receptor positive status [ER+]: Secondary | ICD-10-CM | POA: Insufficient documentation

## 2020-09-05 DIAGNOSIS — D0512 Intraductal carcinoma in situ of left breast: Secondary | ICD-10-CM

## 2020-09-05 DIAGNOSIS — Z510 Encounter for antineoplastic radiation therapy: Secondary | ICD-10-CM | POA: Insufficient documentation

## 2020-09-05 LAB — PREGNANCY, URINE: Preg Test, Ur: NEGATIVE

## 2020-09-05 MED ORDER — ACETAMINOPHEN 500 MG PO TABS
500.0000 mg | ORAL_TABLET | Freq: Once | ORAL | Status: AC
  Administered 2020-09-05: 500 mg via ORAL
  Filled 2020-09-05: qty 1

## 2020-09-05 NOTE — Progress Notes (Signed)
Radiation Oncology         [phone] ________________________________  Name: Sheila [name] MRN: [id number]  Date: 09/05/2020  DOB: 04-01-1971  Follow-Up Visit Note  Outpatient  CC: Willey Blade, MD  Magrinat, Virgie Dad, MD  Diagnosis:      ICD-10-CM   1. Ductal carcinoma in situ (DCIS) of left breast  D05.12 acetaminophen (TYLENOL) tablet 500 mg   Cancer Staging Ductal carcinoma in situ (DCIS) of left breast Staging form: Breast, AJCC 8th Edition - Clinical stage from 07/18/2020: Stage 0 (cTis (DCIS), cN0, cM0, ER+, PR+) - Unsigned  CHIEF COMPLAINT: Here to discuss management of left breast DCIS  Narrative:  The patient returns today for follow-up. She was seen in the multidisciplinary breast clinic on 07/18/2020, during which time it was recommended that she proceed with breast conserving surgery followed by adjuvant radiation therapy.   She has not undergone any significant imaging studies since consultation.  Biopsies, since consultation, involved (dates and results as follows): Biopsy of the left nipple on the date of 07/23/2020 showed fungal dermatitis without malignancy.  Breast surgery on the date of 07/31/2020 revealed: tumor size of 2.0 cm; histology of ductal carcinoma in situ involving adenosis; margin status to in situ disease of <0.1 cm from the anterior margin; ER status: 70% moderate-strong; PR status: 5% moderate-strong; Grade: 3.  She elected to defer re-excision upon discussion w/ her surgeon.  Symptomatically, the patient reports: yeast infection at nipple is improving after oral diflucan and powder topically. - managed by PCP.  She is here w/ her husband and daughter.  Due for her COVID booster.  She is currently menstruating.        ALLERGIES:  has No Known Allergies.  Meds: Current Outpatient Medications  Medication Sig Dispense Refill  . Multiple Vitamin (MULTIVITAMIN WITH MINERALS) TABS tablet Take 1 tablet by mouth at bedtime.    Marland Kitchen  oxyCODONE (OXY IR/ROXICODONE) 5 MG immediate release tablet Take 0.5-1 tablets (2.5-5 mg total) by mouth every 6 (six) hours as needed for severe pain. (Patient not taking: Reported on 09/05/2020) 10 tablet 0   No current facility-administered medications for this encounter.    Physical Findings:  vitals were not taken for this visit. .     General: Alert and oriented, in no acute distress Psychiatric: Judgment and insight are intact. Affect is appropriate. Breast exam reveals erythema and moist desquamation at left nipple which is persistent but improved    Lab Findings: Lab Results  Component Value Date   WBC 7.6 07/18/2020   HGB 10.5 (L) 07/18/2020   HCT 34.8 (L) 07/18/2020   MCV 73.0 (L) 07/18/2020   PLT 380 07/18/2020      Radiographic Findings: No results found.  Impression/Plan: Left breast DCIS  We discussed adjuvant radiotherapy today.  I recommend radiotherapy to the left breast over 4 wks in order to reduce risk of locoregional recurrence by at least half.  I reviewed the logistics, benefits, risks, and potential side effects of this treatment in detail. Risks may include but not necessary be limited to acute and late injury tissue in the radiation fields such as skin irritation (change in color/pigmentation, itching, dryness, pain, peeling). She may experience fatigue. We also discussed possible risk of long term cosmetic changes or scar tissue. There is also a smaller risk for lung toxicity, cardiac toxicity, lymphedema, musculoskeletal changes, rib fragility or induction of a second malignancy, late chronic non-healing soft tissue wound.    The patient asked good  questions which I answered to her satisfaction. She is enthusiastic about proceeding with treatment. A consent form has been signed and placed in her chart.  We will proceed with CT simulation today and start RT next week.  Urine pregnancy test today. She knows to use contraception during radiotherapy.  We  discussed measures to reduce the risk of infection during the COVID-19 pandemic.  She is due for her booster and I again strongly recommended it. She is agreeable today to receive it - we will arrange.   On date of service, in total, I spent 35 minutes on this encounter. Patient was seen in person.  _____________________________________   Lonie Peak, MD  This document serves as a record of services personally performed by Lonie Peak, MD. It was created on his behalf by Nikki Dom, a trained medical scribe. The creation of this record is based on the scribe's personal observations and the provider's statements to them. This document has been checked and approved by the attending provider.

## 2020-09-05 NOTE — Progress Notes (Signed)
   Covid-19 Vaccination Clinic  Name:  ARMINDA FOGLIO    MRN: [id number] DOB: 03-01-1971  09/05/2020  Ms. Hang was observed post Covid-19 immunization for 15 minutes without incident. She was provided with Vaccine Information Sheet and instruction to access the V-Safe system.   Ms. Pedley was instructed to call 911 with any severe reactions post vaccine: Marland Kitchen Difficulty breathing  . Swelling of face and throat  . A fast heartbeat  . A bad rash all over body  . Dizziness and weakness   Immunizations Administered    Name Date Dose VIS Date Route   Pfizer COVID-19 Vaccine 09/05/2020  3:10 PM 0.3 mL 06/20/2020 Intramuscular   Manufacturer: [employer], [employer]   Lot: [id number]   NDC: [address]-3

## 2020-09-07 DIAGNOSIS — Z510 Encounter for antineoplastic radiation therapy: Secondary | ICD-10-CM | POA: Diagnosis not present

## 2020-09-10 ENCOUNTER — Encounter: Payer: Self-pay | Admitting: *Deleted

## 2020-09-12 ENCOUNTER — Other Ambulatory Visit: Payer: Self-pay

## 2020-09-12 ENCOUNTER — Ambulatory Visit
Admission: RE | Admit: 2020-09-12 | Discharge: 2020-09-12 | Disposition: A | Discharge status: Home / Self Care | Payer: 59 | Source: Ambulatory Visit | Attending: Radiation Oncology | Admitting: Radiation Oncology

## 2020-09-12 DIAGNOSIS — Z510 Encounter for antineoplastic radiation therapy: Secondary | ICD-10-CM | POA: Diagnosis not present

## 2020-09-13 ENCOUNTER — Ambulatory Visit
Admission: RE | Admit: 2020-09-13 | Discharge: 2020-09-13 | Disposition: A | Discharge status: Home / Self Care | Payer: 59 | Source: Ambulatory Visit | Attending: Radiation Oncology | Admitting: Radiation Oncology

## 2020-09-13 DIAGNOSIS — Z510 Encounter for antineoplastic radiation therapy: Secondary | ICD-10-CM | POA: Diagnosis not present

## 2020-09-14 ENCOUNTER — Ambulatory Visit
Admission: RE | Admit: 2020-09-14 | Discharge: 2020-09-14 | Disposition: A | Discharge status: Home / Self Care | Payer: 59 | Source: Ambulatory Visit | Attending: Radiation Oncology | Admitting: Radiation Oncology

## 2020-09-14 DIAGNOSIS — Z510 Encounter for antineoplastic radiation therapy: Secondary | ICD-10-CM | POA: Diagnosis not present

## 2020-09-17 ENCOUNTER — Ambulatory Visit
Admission: RE | Admit: 2020-09-17 | Discharge: 2020-09-17 | Disposition: A | Discharge status: Home / Self Care | Payer: 59 | Source: Ambulatory Visit | Attending: Radiation Oncology | Admitting: Radiation Oncology

## 2020-09-17 DIAGNOSIS — Z510 Encounter for antineoplastic radiation therapy: Secondary | ICD-10-CM | POA: Diagnosis not present

## 2020-09-18 ENCOUNTER — Ambulatory Visit
Admission: RE | Admit: 2020-09-18 | Discharge: 2020-09-18 | Disposition: A | Discharge status: Home / Self Care | Payer: 59 | Source: Ambulatory Visit | Attending: Radiation Oncology | Admitting: Radiation Oncology

## 2020-09-18 ENCOUNTER — Other Ambulatory Visit: Payer: Self-pay

## 2020-09-18 DIAGNOSIS — Z510 Encounter for antineoplastic radiation therapy: Secondary | ICD-10-CM | POA: Diagnosis not present

## 2020-09-19 ENCOUNTER — Ambulatory Visit: Payer: 59

## 2020-09-19 ENCOUNTER — Ambulatory Visit
Admission: RE | Admit: 2020-09-19 | Discharge: 2020-09-19 | Disposition: A | Discharge status: Home / Self Care | Payer: 59 | Source: Ambulatory Visit | Attending: Radiation Oncology | Admitting: Radiation Oncology

## 2020-09-19 DIAGNOSIS — D0512 Intraductal carcinoma in situ of left breast: Secondary | ICD-10-CM

## 2020-09-19 DIAGNOSIS — Z510 Encounter for antineoplastic radiation therapy: Secondary | ICD-10-CM | POA: Diagnosis not present

## 2020-09-19 MED ORDER — ALRA NON-METALLIC DEODORANT (RAD-ONC)
1.0000 "application " | Freq: Once | TOPICAL | Status: AC
  Administered 2020-09-19: 1 via TOPICAL

## 2020-09-19 MED ORDER — RADIAPLEXRX EX GEL: Freq: Once | CUTANEOUS | Status: AC

## 2020-09-19 NOTE — Progress Notes (Signed)

## 2020-09-20 ENCOUNTER — Ambulatory Visit
Admission: RE | Admit: 2020-09-20 | Discharge: 2020-09-20 | Disposition: A | Discharge status: Home / Self Care | Payer: 59 | Source: Ambulatory Visit | Attending: Radiation Oncology | Admitting: Radiation Oncology

## 2020-09-20 ENCOUNTER — Ambulatory Visit: Payer: 59

## 2020-09-20 DIAGNOSIS — Z510 Encounter for antineoplastic radiation therapy: Secondary | ICD-10-CM | POA: Diagnosis not present

## 2020-09-21 ENCOUNTER — Ambulatory Visit
Admission: RE | Admit: 2020-09-21 | Discharge: 2020-09-21 | Disposition: A | Discharge status: Home / Self Care | Payer: 59 | Source: Ambulatory Visit | Attending: Radiation Oncology | Admitting: Radiation Oncology

## 2020-09-21 ENCOUNTER — Other Ambulatory Visit: Payer: Self-pay

## 2020-09-21 ENCOUNTER — Ambulatory Visit: Payer: 59

## 2020-09-21 DIAGNOSIS — Z510 Encounter for antineoplastic radiation therapy: Secondary | ICD-10-CM | POA: Diagnosis not present

## 2020-09-22 NOTE — Progress Notes (Signed)
[place]  Telephone:([phone]) [phone] Fax:(336) [phone]     ID: Sheila [name] DOB: 09-Nov-1970  MR#: [id number]  FXO#:[id number]  Patient Care Team: Sheila Blade, MD as PCP - General (Internal Medicine) Sheila Kaufmann, RN as Oncology Nurse Navigator Sheila Germany, RN as Oncology Nurse Navigator Sheila Klein, MD as Consulting Physician (General Surgery) Sheila [name], Sheila Dad, MD as Consulting Physician (Oncology) Sheila Gibson, MD as Attending Physician (Radiation Oncology) Sheila Crocker, MD as Consulting Physician (Obstetrics and Gynecology) Sheila Cruel, MD OTHER MD:  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 She was evaluated in the multidisciplinary breast cancer clinic on 07/18/2020.  Genetic testing performed during clinic was negative, with a variant of uncertain significance in MSH2.  Since consultation, she underwent biopsy of the lesion on her left nipple on 07/23/2020. Pathology from the procedure ([id number]) showed fungal dermatitis, morphologically consistent with Candida.  She proceeded to left lumpectomy on 07/31/2020 under Dr. Barry [name]. Pathology [id number] [id number]) showed: ductal carcinoma in situ, high grade, involving adenosis; margins uninvolved.  She was referred back to Dr. Isidore [name] on 09/05/2020 to review radiation therapy. She subsequently began treatment on 09/12/2020 and is scheduled to finish on 10/09/2020.   REVIEW OF SYSTEMS: Sheila [name] did well with her surgery although she did have pain for about 2 weeks she says.  She has a little bit of swelling now and the left breast is still sensitive and a little bit on the sore side. She is tolerating radiation with no significant symptoms so far.  A detailed review of systems today was otherwise entirely benign   COVID 19 VACCINATION  STATUS: fully vaccinated [employer]), with booster January /2022   HISTORY OF CURRENT ILLNESS: From the original intake note:  Sheila [name] noted changes in her left nipple sometime around May 2021. She thought it might have been a burn from a hot water bottle or a slight infection. She underwent routine  bilateral diagnostic mammography with tomography at [facility] on 06/15/2020 showing: breast density category B; 2 cm grouped calcifications in upper-outer left breast.  Accordingly on 07/12/2020 she proceeded to biopsy of the left breast area in question. The pathology from this procedure ([id number]) showed: ductal carcinoma in situ, intermediate to high grade, with necrosis and calcifications, focally suspicious for microinvasion. Prognostic indicators significant for: estrogen receptor, 70% positive and progesterone receptor, 5% positive, both with moderate-strong staining intensity.   The patient's subsequent history is as detailed below.   PAST MEDICAL HISTORY: Past Medical History:  Diagnosis Date  . Asthma    childhood - resolved, none as an adult - no inhaler  . Breast cancer ([place])   . Seasonal allergies    History of uterine fibroids   PAST SURGICAL HISTORY: Past Surgical History:  Procedure Laterality Date  . BREAST LUMPECTOMY WITH RADIOACTIVE SEED LOCALIZATION Left 07/31/2020   Procedure: LEFT BREAST LUMPECTOMY WITH RADIOACTIVE SEED LOCALIZATION;  Surgeon: Sheila Klein, MD;  Location: [facility];  Service: General;  Laterality: Left;  RNFA  . CESAREAN SECTION     x [name]     FAMILY HISTORY: No family history on file.  Her father died at age 31 from small bowel obstruction. Her mother is living at age 37 as of 07/2020. Sheila [name] has three brothers (and no sisters). There is no family history of cancer to her knowledge.   GYNECOLOGIC HISTORY:  No  LMP recorded. Menarche: 50 years old Age at first live birth: 50 years old Sheila [name] LMP "ongoing" Contraceptive: used for about 10  years HRT n/a  Hysterectomy? no BSO? 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 She is originally from [place], came to the [place] age 81.. Husband Dr. Nolene [name] is of course one of our internists locally. 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  She lives at home with husband Sheila [name] and their Sheila [name], who is 58. Sheila [name], age 23, is an attorney in [place]. Sheila [name], age 57, is a Careers information officer in [place].     ADVANCED DIRECTIVES: In the absence of any documentation to the contrary, the patient's spouse is their HCPOA.    HEALTH MAINTENANCE: Social History   Tobacco Use  . Smoking status: Never Smoker  . Smokeless tobacco: Never Used  Vaping Use  . Vaping Use: Never used  Substance Use Topics  . Alcohol use: No  . Drug use: No     Colonoscopy: Cologard pending  PAP: 2021  Bone density: never done (age)   No Known Allergies  Current Outpatient Medications  Medication Sig Dispense Refill  . Multiple Vitamin (MULTIVITAMIN WITH MINERALS) TABS tablet Take 1 tablet by mouth at bedtime.    Marland Kitchen oxyCODONE (OXY IR/ROXICODONE) 5 MG immediate release tablet Take 0.5-1 tablets (2.5-5 mg total) by mouth every 6 (six) hours as needed for severe pain. (Patient not taking: Reported on 09/05/2020) 10 tablet 0   No current facility-administered medications for this visit.   Facility-Administered Medications Ordered in Other Visits  Medication Dose Route Frequency Provider Last Rate Last Admin  . silver sulfADIAZINE (SILVADENE) 1 % cream   Topical BID Sheila Gibson, MD   Given at 09/24/20 [phone]    OBJECTIVE: African=American woman who appears younger than stated age  50:   09/24/20 1326  BP: 111/63  Pulse: 62  Resp: 18  Temp: 97.7 F (36.5 C)  SpO2: 100%     Body mass index is 29.52 kg/m.   Wt Readings from Last [name] Encounters:  09/24/20 172 lb (78 kg)  09/05/20 175 lb 9.6  oz (79.7 kg)  07/31/20 165 lb (74.8 kg)      ECOG FS:1 - Symptomatic but completely ambulatory  Sclerae unicteric, EOMs intact Wearing a mask No cervical or supraclavicular adenopathy Lungs no rales or rhonchi Heart regular rate and rhythm Abd soft, nontender, positive bowel sounds MSK no focal spinal tenderness, no upper extremity lymphedema Neuro: nonfocal, well oriented, appropriate affect Breasts: The right breast is benign.  The left breast is status post recent lumpectomy.  The cosmetic result is excellent.  There is minimal skin change from radiation at this point  LAB RESULTS:  CMP     Component Value Date/Time   NA 137 07/18/2020 1229   K [name].8 07/18/2020 1229   CL 105 07/18/2020 1229   CO2 25 07/18/2020 1229   GLUCOSE 82 07/18/2020 1229   BUN 8 07/18/2020 1229   CREATININE 0.81 07/18/2020 1229   CALCIUM 9.1 07/18/2020 1229   PROT 7.6 07/18/2020 1229   ALBUMIN [name].7 07/18/2020 1229   AST 20 07/18/2020 1229   ALT 18 07/18/2020 1229   ALKPHOS 47 07/18/2020 1229   BILITOT 0.5 07/18/2020 1229   GFRNONAA >60 07/18/2020 1229    No results found for: TOTALPROTELP, ALBUMINELP, A1GS, A2GS, BETS, BETA2SER, [place], MSPIKE, SPEI  Lab Results  Component Value Date   WBC 7.6 07/18/2020   NEUTROABS 4.1 07/18/2020   HGB 10.5 (L) 07/18/2020   HCT 34.8 (L) 07/18/2020   MCV 73.0 (L) 07/18/2020   PLT 380 07/18/2020    No results found for: LABCA2  No components found for: [id number]  No results for input(s): INR in the last 168 hours.  No results found for: LABCA2  No results found for: MNO177  No results found for: NHA579  No results found for: UXY333  No results found for: CA2729  No components found for: HGQUANT  No results found for: CEA1 / No results found for: CEA1   No results found for: AFPTUMOR  No results found for: CHROMOGRNA  No results found for: KPAFRELGTCHN, LAMBDASER, KAPLAMBRATIO (kappa/lambda light chains)  No results found for: HGBA,  HGBA2QUANT, HGBFQUANT, HGBSQUAN (Hemoglobinopathy evaluation)   No results found for: LDH  No results found for: IRON, TIBC, IRONPCTSAT (Iron and TIBC)  No results found for: FERRITIN  Urinalysis No results found for: COLORURINE, APPEARANCEUR, LABSPEC, PHURINE, GLUCOSEU, HGBUR, BILIRUBINUR, KETONESUR, PROTEINUR, UROBILINOGEN, NITRITE, LEUKOCYTESUR   STUDIES: No results found.   ELIGIBLE FOR AVAILABLE RESEARCH PROTOCOL: AET  ASSESSMENT: 50 y.o. Sheila [name] woman s/p Left breast biopsy 07/12/2020 for ductal carcinoma in situ, grade 2 or [name], estrogen and progesterone receptor positive.  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  She understands she has a very good prognosis overall and that this cancer, to reinforce the point, is not life-threatening.  She does have a 1 %/year chance of developing another breast cancer in either breast.  This new breast cancer could be invasive or noninvasive.  For that reason we reviewed the role of antiestrogens in her situation.  We discussed the difference between tamoxifen and anastrozole in detail. She understands that anastrozole and the aromatase inhibitors in general work by blocking estrogen production. Accordingly vaginal dryness, decrease in bone density, and of course hot flashes can result. The aromatase inhibitors can also negatively affect the cholesterol profile, although that is a minor effect. One out of 5 women on aromatase inhibitors we will feel "old and achy". This arthralgia/myalgia syndrome, which resembles fibromyalgia clinically, does resolve with stopping the medications. Accordingly this is not a reason to not try an aromatase inhibitor  but it is a frequent reason to stop it (in other words 20% of women will not be able to tolerate these medications).  Tamoxifen on the other hand does not block estrogen production. It does not "take away a woman's estrogen". It blocks the estrogen receptor in breast cells. Like anastrozole, it can also cause hot flashes. As opposed to anastrozole, tamoxifen has many estrogen-like effects. It is technically an estrogen receptor modulator. This means that in some tissues tamoxifen works like estrogen-- for example it helps strengthen the bones. It tends to improve the cholesterol profile. It can cause thickening of the endometrial lining, and even endometrial polyps or rarely cancer of the uterus.(The risk of uterine cancer due to tamoxifen is one additional cancer per thousand women year). It can cause vaginal wetness or stickiness. It can cause blood clots through this estrogen-like effect--the risk of blood clots with tamoxifen is exactly the same as  with birth control pills or hormone replacement.  Neither of these agents causes mood changes or weight gain, despite the popular belief that they can have these side effects. We have data from studies comparing either of these drugs with placebo, and in those cases the control group had the same amount of weight gain and depression as the group that took the drug.  I wrote a prescription for tamoxifen and suggest that she started 10/30/2020.  We will then have a virtual visit in April.  At that point if she did started we can assess tolerance.  If she decided against antiestrogens we will set her up for her new baseline mammogram and further follow-up  Total encounter time 25 minutes.Sarajane Jews C. Jayziah Bankhead, MD 09/24/2020 1:44 PM Medical Oncology and Hematology [facility] [place], [place] [address] Tel. [phone]    Fax. [phone]   This document serves as a record of services personally performed by Lurline Del,  MD. It was created on his behalf by Wilburn Mylar, a trained medical scribe. The creation of this record is based on the scribe's personal observations and the provider's statements to them.   I, Lurline Del MD, have reviewed the above documentation for accuracy and completeness, and I agree with the above.   *Total Encounter Time as defined by the Centers for Medicare and Medicaid Services includes, in addition to the face-to-face time of a patient visit (documented in the note above) non-face-to-face time: obtaining and reviewing outside history, ordering and reviewing medications, tests or procedures, care coordination (communications with other health care professionals or caregivers) and documentation in the medical record.

## 2020-09-24 ENCOUNTER — Ambulatory Visit: Payer: 59 | Admitting: Radiation Oncology

## 2020-09-24 ENCOUNTER — Ambulatory Visit
Admission: RE | Admit: 2020-09-24 | Discharge: 2020-09-24 | Disposition: A | Discharge status: Home / Self Care | Payer: 59 | Source: Ambulatory Visit | Attending: Radiation Oncology | Admitting: Radiation Oncology

## 2020-09-24 ENCOUNTER — Other Ambulatory Visit: Payer: Self-pay

## 2020-09-24 ENCOUNTER — Inpatient Hospital Stay (HOSPITAL_BASED_OUTPATIENT_CLINIC_OR_DEPARTMENT_OTHER): Payer: 59 | Admitting: Oncology

## 2020-09-24 ENCOUNTER — Ambulatory Visit: Payer: 59

## 2020-09-24 VITALS — BP 111/63 | HR 62 | Temp 97.7°F | Resp 18 | Ht 64.0 in | Wt 172.0 lb

## 2020-09-24 DIAGNOSIS — D0512 Intraductal carcinoma in situ of left breast: Secondary | ICD-10-CM | POA: Diagnosis not present

## 2020-09-24 DIAGNOSIS — Z510 Encounter for antineoplastic radiation therapy: Secondary | ICD-10-CM | POA: Diagnosis not present

## 2020-09-24 MED ORDER — SILVER SULFADIAZINE 1 % EX CREA: TOPICAL_CREAM | Freq: Two times a day (BID) | CUTANEOUS | Status: DC

## 2020-09-24 MED ORDER — TAMOXIFEN CITRATE 20 MG PO TABS: 20.0000 mg | ORAL_TABLET | Freq: Every day | ORAL | 4 refills | Status: AC

## 2020-09-25 ENCOUNTER — Ambulatory Visit
Admission: RE | Admit: 2020-09-25 | Discharge: 2020-09-25 | Disposition: A | Discharge status: Home / Self Care | Payer: 59 | Source: Ambulatory Visit | Attending: Radiation Oncology | Admitting: Radiation Oncology

## 2020-09-25 DIAGNOSIS — Z510 Encounter for antineoplastic radiation therapy: Secondary | ICD-10-CM | POA: Diagnosis not present

## 2020-09-26 ENCOUNTER — Telehealth: Payer: Self-pay | Admitting: Oncology

## 2020-09-26 ENCOUNTER — Ambulatory Visit
Admission: RE | Admit: 2020-09-26 | Discharge: 2020-09-26 | Disposition: A | Discharge status: Home / Self Care | Payer: 59 | Source: Ambulatory Visit | Attending: Radiation Oncology | Admitting: Radiation Oncology

## 2020-09-26 DIAGNOSIS — Z510 Encounter for antineoplastic radiation therapy: Secondary | ICD-10-CM | POA: Diagnosis not present

## 2020-09-26 NOTE — Telephone Encounter (Signed)
Scheduled appts per 1/24 los. Pt confirmed appt date and time.  

## 2020-09-27 ENCOUNTER — Ambulatory Visit
Admission: RE | Admit: 2020-09-27 | Discharge: 2020-09-27 | Disposition: A | Discharge status: Home / Self Care | Payer: 59 | Source: Ambulatory Visit | Attending: Radiation Oncology | Admitting: Radiation Oncology

## 2020-09-27 DIAGNOSIS — Z510 Encounter for antineoplastic radiation therapy: Secondary | ICD-10-CM | POA: Diagnosis not present

## 2020-09-28 ENCOUNTER — Ambulatory Visit
Admission: RE | Admit: 2020-09-28 | Discharge: 2020-09-28 | Disposition: A | Discharge status: Home / Self Care | Payer: 59 | Source: Ambulatory Visit | Attending: Radiation Oncology | Admitting: Radiation Oncology

## 2020-09-28 DIAGNOSIS — Z510 Encounter for antineoplastic radiation therapy: Secondary | ICD-10-CM | POA: Diagnosis not present

## 2020-10-01 ENCOUNTER — Ambulatory Visit
Admission: RE | Admit: 2020-10-01 | Discharge: 2020-10-01 | Disposition: A | Discharge status: Home / Self Care | Payer: 59 | Source: Ambulatory Visit | Attending: Radiation Oncology | Admitting: Radiation Oncology

## 2020-10-01 ENCOUNTER — Ambulatory Visit: Payer: 59 | Admitting: Radiation Oncology

## 2020-10-01 DIAGNOSIS — Z510 Encounter for antineoplastic radiation therapy: Secondary | ICD-10-CM | POA: Diagnosis not present

## 2020-10-02 ENCOUNTER — Ambulatory Visit
Admission: RE | Admit: 2020-10-02 | Discharge: 2020-10-02 | Disposition: A | Discharge status: Home / Self Care | Payer: 59 | Source: Ambulatory Visit | Attending: Radiation Oncology | Admitting: Radiation Oncology

## 2020-10-02 DIAGNOSIS — D0512 Intraductal carcinoma in situ of left breast: Secondary | ICD-10-CM | POA: Insufficient documentation

## 2020-10-03 ENCOUNTER — Ambulatory Visit
Admission: RE | Admit: 2020-10-03 | Discharge: 2020-10-03 | Disposition: A | Discharge status: Home / Self Care | Payer: 59 | Source: Ambulatory Visit | Attending: Radiation Oncology | Admitting: Radiation Oncology

## 2020-10-03 DIAGNOSIS — D0512 Intraductal carcinoma in situ of left breast: Secondary | ICD-10-CM | POA: Diagnosis not present

## 2020-10-04 ENCOUNTER — Ambulatory Visit
Admission: RE | Admit: 2020-10-04 | Discharge: 2020-10-04 | Disposition: A | Discharge status: Home / Self Care | Payer: 59 | Source: Ambulatory Visit | Attending: Radiation Oncology | Admitting: Radiation Oncology

## 2020-10-04 DIAGNOSIS — D0512 Intraductal carcinoma in situ of left breast: Secondary | ICD-10-CM | POA: Diagnosis not present

## 2020-10-05 ENCOUNTER — Encounter: Payer: Self-pay | Admitting: *Deleted

## 2020-10-05 ENCOUNTER — Ambulatory Visit
Admission: RE | Admit: 2020-10-05 | Discharge: 2020-10-05 | Disposition: A | Discharge status: Home / Self Care | Payer: 59 | Source: Ambulatory Visit | Attending: Radiation Oncology | Admitting: Radiation Oncology

## 2020-10-05 DIAGNOSIS — D0512 Intraductal carcinoma in situ of left breast: Secondary | ICD-10-CM | POA: Diagnosis not present

## 2020-10-08 ENCOUNTER — Ambulatory Visit
Admission: RE | Admit: 2020-10-08 | Discharge: 2020-10-08 | Disposition: A | Discharge status: Home / Self Care | Payer: 59 | Source: Ambulatory Visit | Attending: Radiation Oncology | Admitting: Radiation Oncology

## 2020-10-08 DIAGNOSIS — D0512 Intraductal carcinoma in situ of left breast: Secondary | ICD-10-CM

## 2020-10-08 MED ORDER — SILVER SULFADIAZINE 1 % EX CREA: TOPICAL_CREAM | Freq: Two times a day (BID) | CUTANEOUS | Status: DC

## 2020-10-09 ENCOUNTER — Ambulatory Visit
Admission: RE | Admit: 2020-10-09 | Discharge: 2020-10-09 | Disposition: A | Discharge status: Home / Self Care | Payer: 59 | Source: Ambulatory Visit | Attending: Radiation Oncology | Admitting: Radiation Oncology

## 2020-10-09 ENCOUNTER — Encounter: Payer: Self-pay | Admitting: Radiation Oncology

## 2020-10-09 DIAGNOSIS — D0512 Intraductal carcinoma in situ of left breast: Secondary | ICD-10-CM | POA: Diagnosis not present

## 2020-10-26 ENCOUNTER — Ambulatory Visit: Payer: 59 | Admitting: Rehabilitation

## 2020-11-02 ENCOUNTER — Encounter: Payer: Self-pay | Admitting: Rehabilitation

## 2020-11-02 ENCOUNTER — Other Ambulatory Visit: Payer: Self-pay

## 2020-11-02 ENCOUNTER — Ambulatory Visit: Payer: 59 | Attending: General Surgery | Admitting: Rehabilitation

## 2020-11-02 DIAGNOSIS — L599 Disorder of the skin and subcutaneous tissue related to radiation, unspecified: Secondary | ICD-10-CM | POA: Diagnosis present

## 2020-11-02 DIAGNOSIS — Z483 Aftercare following surgery for neoplasm: Secondary | ICD-10-CM | POA: Diagnosis present

## 2020-11-02 DIAGNOSIS — I890 Lymphedema, not elsewhere classified: Secondary | ICD-10-CM | POA: Diagnosis present

## 2020-11-02 NOTE — Therapy (Signed)
[place], [place], [address] Phone: [phone]   Fax:  [phone]  Physical Therapy Evaluation  Patient Details  Name: Sheila [name] MRN: [id number] Date of Birth: 31-Mar-1971 Referring Provider (PT): Dr. Barry Dienes   Encounter Date: 11/02/2020   PT End of Session - 11/02/20 1106    Visit Number 1    Number of Visits 6    Date for PT Re-Evaluation 12/14/20    Authorization - Visit Number 1    Authorization - Number of Visits 27    PT Start Time 1009    PT Stop Time 1055    PT Time Calculation (min) 46 min    Activity Tolerance Patient tolerated treatment well    Behavior During Therapy [facility] for tasks assessed/performed           Past Medical History:  Diagnosis Date  . Asthma    childhood - resolved, none as an adult - no inhaler  . Breast cancer ([place])   . Seasonal allergies     Past Surgical History:  Procedure Laterality Date  . BREAST LUMPECTOMY WITH RADIOACTIVE SEED LOCALIZATION Left 07/31/2020   Procedure: LEFT BREAST LUMPECTOMY WITH RADIOACTIVE SEED LOCALIZATION;  Surgeon: [name], MD;  Location: [facility];  Service: General;  Laterality: Left;  RNFA  . CESAREAN SECTION     x 3     There were no vitals filed for this visit.    Subjective Assessment - 11/02/20 1009    Subjective The left breast was swollen but I think it went down.    Pertinent History Lt lumpectomy 07/31/20 due to DCIS with no lymph nodes removed.  Completed radiation 10/09/20.  No other health problems    Limitations --   none   Patient Stated Goals anything i need to do    Currently in Pain? No/denies   only when I touch the breast  or move the arm             [facility] PT Assessment - 11/02/20 0001      Assessment   Medical Diagnosis left breast cancer    Referring Provider (PT) Dr. Barry Dienes    Onset Date/Surgical Date 07/31/20    Hand Dominance Right      Precautions   Precaution Comments radiation  left      Restrictions   Weight Bearing Restrictions No      Balance Screen   Has the patient fallen in the past 6 months No    Has the patient had a decrease in activity level because of a fear of falling?  No    Is the patient reluctant to leave their home because of a fear of falling?  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    Leisure swimming, walking      Cognition   Overall Cognitive Status Within Functional Limits for tasks assessed      Observation/Other Assessments   Observations wearing regular type bra    Skin Integrity left breast is overall larger, healing from radiation with enlarged pores and darkened sloughing skin, pink healing nipple from open fungal infection per patient      Coordination   Gross Motor Movements are Fluid  and Coordinated Yes      Posture/Postural Control   Posture/Postural Control Postural limitations    Postural Limitations Rounded Shoulders;Forward head      ROM / Strength   AROM / PROM / Strength PROM      PROM   PROM Assessment Site Shoulder    Right/Left Shoulder Right;Left    Left Shoulder Flexion 155 Degrees   pull   Left Shoulder ABduction 158 Degrees   pull     Palpation   Palpation comment overall soft edema in the left breast; no fibrosis noted                      Objective measurements completed on examination: See above findings.       [place] Adult PT Treatment/Exercise - 11/02/20 0001      Manual Therapy   Manual Therapy Manual Lymphatic Drainage (MLD)    Manual therapy comments gave pt prescription for bras at [employer] and discussed importance of compresison over MLD.  Gave pt handout on self MLD for the left breast with daughter seeming to be doing some of  the treatment as well.    Manual Lymphatic Drainage (MLD) in supine HOB anatomy and physiology of the lymphatic system and then with pt permission short neck, deep and superficial abdominals, sternal nodes, bil axillary nodes, lt inguinal nodes, anterior interaxillary pathway and left axillo inguinal pathway and then left breast towards pathways and then in right sidelying lateral chest                  PT Education - 11/02/20 1105    Education Details POC, MLD, lymphedema, self MLD handout only    Person(s) Educated Patient;Child(ren)    Methods Explanation;Demonstration;Tactile cues;Verbal cues;Handout    Comprehension Verbalized understanding;Returned demonstration;Verbal cues required;Tactile cues required;Need further instruction               PT Long Term Goals - 11/02/20 1152      PT LONG TERM GOAL #1   Title Pt will decrease left breast heaviness and discomfort by at least 50%    Time 6    Period Weeks    Status New      PT LONG TERM GOAL #2   Title Pt will be ind with self MLD    Time 6    Period Weeks    Status New      PT LONG TERM GOAL #3   Title Pt will be ind with HEP for continued shoulder mobility    Time 6    Period Weeks    Status New                  Plan - 11/02/20 1107    Clinical Impression Statement Pt presents with soft edema in the left breast 4 weeks post radiation, fungal infection in the nipple, and lumpectomy with no lymph nodes removed.  Pt reports that brief session of MLD was helpful.  Pt will benefit from PT at this time to decrease edema and heaviness in the breast.  AROM not measured today but PROM demonstrates pulling in the axilla with flexion and abduction.    Personal Factors and Comorbidities Comorbidity 2    Comorbidities lumpectomy, radiation    Examination-Activity Limitations Reach Overhead    Stability/Clinical Decision Making Stable/Uncomplicated    Clinical Decision Making Low    Rehab Potential Excellent     PT Frequency 1x / week  PT Duration 6 weeks    PT Treatment/Interventions ADLs/Self Care Home Management;Therapeutic exercise;Manual lymph drainage;Taping;Patient/family education;Manual techniques    PT Next Visit Plan measure AROM, left breast MLD and self MLD review/education questions?  going to get a bra?  PROM of the left shoulder/AAROM    PT Home Exercise Plan self MLD    Recommended Other Services bra    Consulted and Agree with Plan of Care Patient           Patient will benefit from skilled therapeutic intervention in order to improve the following deficits and impairments:  Increased edema,Decreased range of motion,Pain  Visit Diagnosis: Aftercare following surgery for neoplasm  Disorder of the skin and subcutaneous tissue related to radiation, unspecified  Lymphedema, not elsewhere classified     Problem List Patient Active Problem List   Diagnosis Date Noted  . Genetic testing 08/08/2020  . 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 Other and unspecified ovarian cyst 04/11/2013    Stark Bray 11/02/2020, 11:55 AM  [place], [place], [address] Phone: [phone]   Fax:  [phone]  Name: YVONNIE SCHINKE MRN: [id number] Date of Birth: 06/30/1971

## 2020-11-07 ENCOUNTER — Encounter: Payer: Self-pay | Admitting: Radiation Oncology

## 2020-11-07 ENCOUNTER — Ambulatory Visit
Admission: RE | Admit: 2020-11-07 | Discharge: 2020-11-07 | Disposition: A | Discharge status: Home / Self Care | Payer: 59 | Source: Ambulatory Visit | Attending: Radiation Oncology | Admitting: Radiation Oncology

## 2020-11-07 ENCOUNTER — Other Ambulatory Visit: Payer: Self-pay

## 2020-11-07 DIAGNOSIS — D0512 Intraductal carcinoma in situ of left breast: Secondary | ICD-10-CM

## 2020-11-07 NOTE — Progress Notes (Signed)
Radiation Oncology         [phone] ________________________________  Name: Sheila [name] MRN: [id number]  Date: 11/07/2020  DOB: 1971-02-02  Follow-Up Visit Note by telephone.  The patient opted for telemedicine to maximize safety during the pandemic.  MyChart video was not obtainable.  Outpatient  CC: Sheila Blade, MD  Magrinat, Virgie Dad, MD  Diagnosis and Prior Radiotherapy:    ICD-10-CM   1. Ductal carcinoma in situ (DCIS) of left breast  D05.12     Cancer Staging Ductal carcinoma in situ (DCIS) of left breast Staging form: Breast, AJCC 8th Edition - Clinical stage from 07/18/2020: Stage 0 (cTis (DCIS), cN0, cM0, ER+, PR+) - Unsigned Stage prefix: Initial diagnosis Nuclear grade: G2  CHIEF COMPLAINT: Here for follow-up and surveillance of breast DCIS   Narrative:  The patient returns today for routine follow-up.  She and her husband are present by phone today.  They requested a phone follow-up.  They report that she is doing well.  Her L nipple has healed well.  They state that the skin is now intact and no longer moist or desquamated.  She is no longer applying Silvadene to her nipple.  She still has some tenderness over the left chest wall but this has improved slightly.  She has follow-up with medical oncology in April.                              ALLERGIES:  has No Known Allergies.  Meds: Current Outpatient Medications  Medication Sig Dispense Refill  . Multiple Vitamin (MULTIVITAMIN WITH MINERALS) TABS tablet Take 1 tablet by mouth at bedtime.    Marland Kitchen oxyCODONE (OXY IR/ROXICODONE) 5 MG immediate release tablet Take 0.5-1 tablets (2.5-5 mg total) by mouth every 6 (six) hours as needed for severe pain. 10 tablet 0   No current facility-administered medications for this encounter.    Physical Findings: The patient is in no acute distress. Patient is alert and oriented.  vitals were not taken for this visit. .      Lab Findings: Lab Results  Component  Value Date   WBC 7.6 07/18/2020   HGB 10.5 (L) 07/18/2020   HCT 34.8 (L) 07/18/2020   MCV 73.0 (L) 07/18/2020   PLT 380 07/18/2020    Radiographic Findings: No results found.  Impression/Plan: Healing well from radiotherapy to the breast tissue.  Continue skin care with topical Vitamin E Oil and / or cocoa butter for at least 2 more months for further healing.  I encouraged her to continue with yearly mammography as appropriate (for intact breast tissue) and followup with medical oncology. I will see her back on an as-needed basis. I have encouraged her to call if she has any issues or concerns in the future. I wished her the very best.  She understands that the tenderness in her chest wall may take several weeks to fully improve.  She will let Dr. Jana Hakim know how she is feeling when she sees him in April.  I requested that she send a photograph of her nipple for our electronic medical medical record so that we have the baseline for comparison to her next appointment with Dr. Jana Hakim.  She will send this to nursing, [facility].  This encounter was provided by telemedicine platform; patient desired telemedicine during pandemic precautions.  MyChart video was not available and therefore telephone was used. The patient has given verbal consent for this type of  encounter and has been advised to only accept a meeting of this type in a secure network environment. On date of service, in total, I spent 15 minutes on this encounter.   The attendants for this meeting include Eppie Gibson  and Ralene Muskrat During the encounter, Eppie Gibson was located at [facility] Radiation Oncology Department.  Jacob A Lobato was located at home.   _____________________________________   Eppie Gibson, MD

## 2020-11-09 ENCOUNTER — Ambulatory Visit: Payer: 59

## 2020-11-09 ENCOUNTER — Other Ambulatory Visit: Payer: Self-pay

## 2020-11-09 DIAGNOSIS — Z483 Aftercare following surgery for neoplasm: Secondary | ICD-10-CM | POA: Diagnosis not present

## 2020-11-09 DIAGNOSIS — I890 Lymphedema, not elsewhere classified: Secondary | ICD-10-CM

## 2020-11-09 DIAGNOSIS — L599 Disorder of the skin and subcutaneous tissue related to radiation, unspecified: Secondary | ICD-10-CM

## 2020-11-09 NOTE — Therapy (Signed)
[place], [place], [address] Phone: [phone]   Fax:  [phone]  Physical Therapy Treatment  Patient Details  Name: Sheila [name] MRN: [id number] Date of Birth: [birth year] Referring Provider (PT): Dr. Barry Dienes   Encounter Date: 11/09/2020   PT End of Session - 11/09/20 0916    Visit Number 2    Number of Visits 6    Date for PT Re-Evaluation 12/14/20    Authorization - Visit Number 2    Authorization - Number of Visits 27    PT Start Time 0800    PT Stop Time 0853    PT Time Calculation (min) 53 min    Activity Tolerance Patient tolerated treatment well    Behavior During Therapy [facility] for tasks assessed/performed           Past Medical History:  Diagnosis Date  . Asthma    childhood - resolved, none as an adult - no inhaler  . Breast cancer ([place])   . Seasonal allergies     Past Surgical History:  Procedure Laterality Date  . BREAST LUMPECTOMY WITH RADIOACTIVE SEED LOCALIZATION Left 07/31/2020   Procedure: LEFT BREAST LUMPECTOMY WITH RADIOACTIVE SEED LOCALIZATION;  Surgeon: [name], MD;  Location: [place];  Service: General;  Laterality: Left;  RNFA  . CESAREAN SECTION     x 3     There were no vitals filed for this visit.   Subjective Assessment - 11/09/20 0759    Subjective I can feel the difference in the breast. My daughter tried to do the MLD and I tried it also.  I haven't gotten the bra because I am using my sports bra to sleep.    Patient is accompained by: Family member   daughter [name]   Pertinent History Lt lumpectomy 07/31/20 due to DCIS with no lymph nodes removed.  Completed radiation 10/09/20.  No other health problems    Patient Stated Goals anything i need to do    Currently in Pain? No/denies              [facility] PT Assessment - 11/09/20 0001      AROM   Right Shoulder Extension 63 Degrees    Right Shoulder Flexion 156 Degrees    Right Shoulder  ABduction 173 Degrees    Left Shoulder Extension 70 Degrees    Left Shoulder Flexion 149 Degrees    Left Shoulder ABduction 160 Degrees                         [facility] Adult PT Treatment/Exercise - 11/09/20 0001      Manual Therapy   Manual Therapy Manual Lymphatic Drainage (MLD)    Manual Lymphatic Drainage (MLD) in supine HOB and then with pt permission short neck, 5 breaths, bil axillary nodes, lt inguinal nodes, anterior interaxillary pathway and left axillo inguinal pathway and then left breast towards pathways . Pt and her daughter instructed in and practiced.                  PT Education - 11/09/20 0859    Education Details Pt was educated in supine wand flex and scaption, and stargazer stretch x 5    Person(s) Educated Patient;Child(ren)    Methods Explanation    Comprehension Verbalized understanding;Returned demonstration               PT Long Term Goals - 11/02/20 1152  PT LONG TERM GOAL #1   Title Pt will decrease left breast heaviness and discomfort by at least 50%    Time 6    Period Weeks    Status New      PT LONG TERM GOAL #2   Title Pt will be ind with self MLD    Time 6    Period Weeks    Status New      PT LONG TERM GOAL #3   Title Pt will be ind with HEP for continued shoulder mobility    Time 6    Period Weeks    Status New                 Plan - 11/09/20 0917    Clinical Impression Statement Therapy consisted of measuring AROM, instructing in [place] for HEP, and MLD with instruction to pt and daughter.  She is lacking endranges of AROM/PROM only.  Breast continues with soft edema and pt and her daughter used fair technique which improved with repetition. Pt prefers to use a sports bra rather than .purchasing a compression bra.  Continue to review MLD for pt. independence.    Personal Factors and Comorbidities Comorbidity 2    Comorbidities lumpectomy, radiation    Examination-Activity Limitations Reach  Overhead    Stability/Clinical Decision Making Stable/Uncomplicated    Rehab Potential Excellent    PT Frequency 1x / week    PT Duration 6 weeks    PT Treatment/Interventions ADLs/Self Care Home Management;Therapeutic exercise;Manual lymph drainage;Taping;Patient/family education;Manual techniques    PT Next Visit Plan left breast MLD and review with pt/daughter, PROM left shoulder/AA    PT Home Exercise Plan self MLD, AA flex,scaption,ER    Consulted and Agree with Plan of Care Patient           Patient will benefit from skilled therapeutic intervention in order to improve the following deficits and impairments:  Increased edema,Decreased range of motion,Pain  Visit Diagnosis: Aftercare following surgery for neoplasm  Disorder of the skin and subcutaneous tissue related to radiation, unspecified  Lymphedema, not elsewhere classified     Problem List Patient Active Problem List   Diagnosis Date Noted  . Genetic testing 08/08/2020  . 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 Other and unspecified ovarian cyst 04/11/2013    Claris Pong 11/09/2020, 9:35 AM  [place], [place], [address] Phone: [phone]   Fax:  [phone]  Name: PA TENNANT MRN: [id number] Date of Birth: 11/04/1970  Cheral Almas, PT 11/09/20 9:37 AM

## 2020-11-09 NOTE — Patient Instructions (Addendum)
SHOULDER: Flexion - Supine (Cane)        Cancer Rehab [phone]    Hold cane in both hands. Raise arms up overhead. Do not allow back to arch. Hold _5__ seconds. Do __5-10__ times; __1-2__ times a day.  Then with arms in V position let arms come back overhead.  Hold 5 sec 5 times  Shoulder Blade Stretch    Clasp fingers behind head with elbows touching in front of face. Pull elbows back while pressing shoulder blades together. Relax and hold as tolerated, can place pillow under elbow here for comfort as needed and to allow for prolonged stretch.  Repeat __5__ times. Do __1-2__ sessions per day.     SHOULDER: External Rotation - Supine (Cane)    Hold cane with both hands. Rotate arm away from body. Keep elbow on floor and next to body. _5-10__ reps per set, hold 5 seconds, _1-2__ sets per day. Add towel to keep elbow at side.  Copyright  VHI. All rights reserved.

## 2020-11-19 NOTE — Progress Notes (Signed)
  Patient Name: Sheila [name] MRN: [id number] DOB: 05/12/1971 Referring Physician: Lurline Del (Profile Not Attached) Date of Service: 10/09/2020 [facility], [place]                                                        End Of Treatment Note  Diagnoses: D05.12-Intraductal carcinoma in situ of left breast  Cancer Staging: Cancer Staging Ductal carcinoma in situ (DCIS) of left breast Staging form: Breast, AJCC 8th Edition - Clinical stage from 07/18/2020: Stage 0 (cTis (DCIS), cN0, cM0, ER+, PR+) - Unsigned Stage prefix: Initial diagnosis Nuclear grade: G2   Intent: Curative  Radiation Treatment Dates: 09/12/2020 through 10/09/2020 Site Technique Total Dose (Gy) Dose per Fx (Gy) Completed Fx Beam Energies  Breast, Left: Breast_Lt 3D 40.05/40.05 2.67 15/15 6X, 10X  Breast, Left: Breast_Lt_Bst 3D 10/10 2 5/5 6X, 10X   Narrative: The patient tolerated radiation therapy relatively well.  Silvadene was started for ongoing moist desquamation at the areola, which had preceded RT, secondary to diagnosis of yeast infection.  Plan: The patient will follow-up with radiation oncology in 65mo. -----------------------------------  Eppie Gibson, MD

## 2020-11-23 ENCOUNTER — Other Ambulatory Visit: Payer: Self-pay

## 2020-11-23 ENCOUNTER — Ambulatory Visit: Payer: 59 | Admitting: Physical Therapy

## 2020-11-23 DIAGNOSIS — Z483 Aftercare following surgery for neoplasm: Secondary | ICD-10-CM

## 2020-11-23 DIAGNOSIS — I890 Lymphedema, not elsewhere classified: Secondary | ICD-10-CM

## 2020-11-23 DIAGNOSIS — L599 Disorder of the skin and subcutaneous tissue related to radiation, unspecified: Secondary | ICD-10-CM

## 2020-11-23 NOTE — Therapy (Signed)
[place], [place], [address] Phone: [phone]   Fax:  [phone]  Physical Therapy Treatment  Patient Details  Name: Sheila [name] MRN: [id number] Date of Birth: 26-Apr-1971 Referring Provider (PT): Dr. Barry Dienes   Encounter Date: 11/23/2020   PT End of Session - 11/23/20 1154    Visit Number 3    Number of Visits 6    Date for PT Re-Evaluation 12/14/20    PT Start Time 1109    PT Stop Time 1150    PT Time Calculation (min) 41 min    Activity Tolerance Patient tolerated treatment well    Behavior During Therapy [facility] for tasks assessed/performed           Past Medical History:  Diagnosis Date  . Asthma    childhood - resolved, none as an adult - no inhaler  . Breast cancer ([place])   . Seasonal allergies     Past Surgical History:  Procedure Laterality Date  . BREAST LUMPECTOMY WITH RADIOACTIVE SEED LOCALIZATION Left 07/31/2020   Procedure: LEFT BREAST LUMPECTOMY WITH RADIOACTIVE SEED LOCALIZATION;  Surgeon: [name], MD;  Location: [place];  Service: General;  Laterality: Left;  RNFA  . CESAREAN SECTION     x 3     There were no vitals filed for this visit.   Subjective Assessment - 11/23/20 1151    Subjective The main thing is the breast and that  is better.    Pertinent History Lt lumpectomy 07/31/20 due to DCIS with no lymph nodes removed.  Completed radiation 10/09/20.  No other health problems    Currently in Pain? No/denies              [facility] PT Assessment - 11/23/20 0001      AROM   Left Shoulder Flexion 155 Degrees    Left Shoulder ABduction 160 Degrees                         [facility] Adult PT Treatment/Exercise - 11/23/20 0001      Manual Therapy   Manual Therapy Passive ROM;Manual Lymphatic Drainage (MLD)    Manual Lymphatic Drainage (MLD) in supine HOB anatomy and physiology of the lymphatic system and then with pt permission short neck, deep and  superficial abdominals, sternal nodes, bil axillary nodes, lt inguinal nodes, anterior interaxillary pathway and left axillo inguinal pathway and then left breast towards pathways and then in right sidelying lateral chest and posterior interaxillary anastamosis    Passive ROM PROM with stretch into end range                       PT Long Term Goals - 11/23/20 1113      PT LONG TERM GOAL #1   Title Pt will decrease left breast heaviness and discomfort by at least 50%    Baseline still present, but symptoms are decreased close to half    Status On-going      PT LONG TERM GOAL #2   Title Pt will be ind with self MLD    Status Achieved      PT LONG TERM GOAL #3   Title Pt will be ind with HEP for continued shoulder mobility    Status Achieved                 Plan - 11/23/20 1155    Clinical Impression Statement Pt is improving.  She reports she is doing her exercises and self manual lymph drainage as she can at home. She feels she is doing alright with that. He breast does not have any areas of firmness , but does have a little indentation from bra at inferior portion of breast. She feels overall is it better than it was. She wants to continue with one more visit of PT prior to discharge    Personal Factors and Comorbidities Comorbidity 2    Comorbidities lumpectomy, radiation    Examination-Activity Limitations Reach Overhead    Stability/Clinical Decision Making Stable/Uncomplicated    Rehab Potential Excellent    PT Frequency 1x / week    PT Duration 6 weeks    PT Treatment/Interventions ADLs/Self Care Home Management;Therapeutic exercise;Manual lymph drainage;Taping;Patient/family education;Manual techniques    PT Next Visit Plan left breast MLD and review with pt/daughter, PROM left shoulder/AA, answer all questions and discharge    Consulted and Agree with Plan of Care Patient           Patient will benefit from skilled therapeutic intervention in order to  improve the following deficits and impairments:  Increased edema,Decreased range of motion,Pain  Visit Diagnosis: Aftercare following surgery for neoplasm  Disorder of the skin and subcutaneous tissue related to radiation, unspecified  Lymphedema, not elsewhere classified     Problem List Patient Active Problem List   Diagnosis Date Noted  . Genetic testing 08/08/2020  . 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 Other and unspecified ovarian cyst 04/11/2013   Donato Heinz. Owens Shark PT  [name] 11/23/2020, 11:59 AM  [place], [place], [address] Phone: [phone]   Fax:  [phone]  Name: QUIERRA SILVERIO MRN: [id number] Date of Birth: [birth year]

## 2020-11-30 ENCOUNTER — Other Ambulatory Visit: Payer: Self-pay

## 2020-11-30 ENCOUNTER — Ambulatory Visit: Payer: 59 | Attending: General Surgery | Admitting: Physical Therapy

## 2020-11-30 DIAGNOSIS — I890 Lymphedema, not elsewhere classified: Secondary | ICD-10-CM | POA: Insufficient documentation

## 2020-11-30 DIAGNOSIS — Z483 Aftercare following surgery for neoplasm: Secondary | ICD-10-CM | POA: Insufficient documentation

## 2020-11-30 DIAGNOSIS — L599 Disorder of the skin and subcutaneous tissue related to radiation, unspecified: Secondary | ICD-10-CM | POA: Diagnosis present

## 2020-11-30 NOTE — Therapy (Signed)
[place], [place], [address] Phone: [phone]   Fax:  [phone]  Physical Therapy Treatment  Patient Details  Name: Sheila [name] MRN: [id number] Date of Birth: 1971-03-26 Referring Provider (PT): Dr. Barry Dienes   Encounter Date: 11/30/2020   PT End of Session - 11/30/20 1158    Visit Number 4    Number of Visits 6    Date for PT Re-Evaluation 12/14/20    PT Start Time 1105    PT Stop Time 1149    PT Time Calculation (min) 44 min    Activity Tolerance Patient tolerated treatment well    Behavior During Therapy [facility] for tasks assessed/performed           Past Medical History:  Diagnosis Date  . Asthma    childhood - resolved, none as an adult - no inhaler  . Breast cancer ([place])   . Seasonal allergies     Past Surgical History:  Procedure Laterality Date  . BREAST LUMPECTOMY WITH RADIOACTIVE SEED LOCALIZATION Left 07/31/2020   Procedure: LEFT BREAST LUMPECTOMY WITH RADIOACTIVE SEED LOCALIZATION;  Surgeon: [name], MD;  Location: [place];  Service: General;  Laterality: Left;  RNFA  . CESAREAN SECTION     x 3     There were no vitals filed for this visit.   Subjective Assessment - 11/30/20 1111    Subjective Pt says that she is feeling better and that this will be her last treatment. She says her shoulder is doing fine and is able to manage her breast swelling    Pertinent History Lt lumpectomy 07/31/20 due to DCIS with no lymph nodes removed.  Completed radiation 10/09/20.  No other health problems    Currently in Pain? No/denies                             [facility] Adult PT Treatment/Exercise - 11/30/20 0001      Manual Therapy   Manual Lymphatic Drainage (MLD) in supine HOB anatomy and physiology of the lymphatic system and then with pt permission short neck, deep and superficial abdominals, sternal nodes, bil axillary nodes, lt inguinal nodes, anterior  interaxillary pathway and left axillo inguinal pathway and then left breast towards pathways and then in right sidelying lateral chest and posterior interaxillary anastamosis    Passive ROM PROM with stretch into end range                       PT Long Term Goals - 11/30/20 1112      PT LONG TERM GOAL #1   Title Pt will decrease left breast heaviness and discomfort by at least 50%    Baseline pt feels it is decreased by at least 50%    Status Achieved      PT LONG TERM GOAL #2   Title Pt will be ind with self MLD    Status Achieved      PT LONG TERM GOAL #3   Title Pt will be ind with HEP for continued shoulder mobility    Status Achieved                 Plan - 11/30/20 1158    Clinical Impression Statement Pt feels like she is doing well. She is looking forward to exercising in the pool.  She is still wearing her sports bra and has information to get a  compression bra.  While she still has some skin changes with darkened pores and fullness in breast with stiffness in shoulder, she feels like she knows what to do and is ready to discharge from PT at this time. She will call for another script if she feels she needs to come back    Comorbidities lumpectomy, radiation    Examination-Activity Limitations Reach Overhead    Stability/Clinical Decision Making Stable/Uncomplicated    Rehab Potential Excellent    PT Frequency 1x / week    PT Treatment/Interventions ADLs/Self Care Home Management;Therapeutic exercise;Manual lymph drainage;Taping;Patient/family education;Manual techniques    PT Next Visit Plan dishcharge    Consulted and Agree with Plan of Care Patient           Patient will benefit from skilled therapeutic intervention in order to improve the following deficits and impairments:  Increased edema,Decreased range of motion,Pain  Visit Diagnosis: Aftercare following surgery for neoplasm  Disorder of the skin and subcutaneous tissue related to  radiation, unspecified  Lymphedema, not elsewhere classified     Problem List Patient Active Problem List   Diagnosis Date Noted  . Genetic testing 08/08/2020  . 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 Other and unspecified ovarian cyst 04/11/2013   PHYSICAL THERAPY DISCHARGE SUMMARY  Visits from Start of Care: 4  Current functional level related to goals / functional outcomes: Pt reprts she is independent in self care   Remaining deficits: Lymphedema in left breast, stiffness in left shoulder   Education / Equipment: Self management of lymphedema, HEP Plan: Patient agrees to discharge.  Patient goals were met. Patient is being discharged due to being pleased with the current functional level.  ?????    Donato Heinz. Owens Shark PT  [name] 11/30/2020, 12:02 PM  [place], [place], [address] Phone: [phone]   Fax:  [phone]  Name: Sheila [name] MRN: [id number] Date of Birth: 1970-11-21

## 2020-11-30 NOTE — Patient Instructions (Signed)
Manual Lymph Drainage for Left Breast.  Do daily.  Do slowly. Use flat hands with just enough pressure to stretch the skin. Do not slide over the skin, but move the skin with the hand you're using. Lie down or sit comfortably (in a recliner, for example) to do this.  1) Hug yourself:  cross arms and do circles at collar bones near neck 5-7 times (to "wake up" lots of lymph nodes in this area). 2) Take slow deep breaths, allowing your belly to balloon out as your breathe in, 5x (to "wake up" abdominal lymph nodes to take on extra fluid). 3) Right armpit-stretch skin in small circles to stimulate intact lymph nodes there, 5-7x. 4) Left groin area, at panty line-stretch skin in small circles to stimulate lymph nodes 5-7x. 5) Redirect fluid from left chest toward right armpit (stretch skin starting at left chest in 3-4 spots working toward right armpit) 3-4x across the chest. 6) Redirect fluid from left armpit toward left groin (cup your hand around the curve of your left side and do 3-4 "pumps" from armpit to groin) 3-4x down your side. 7) Draw an imaginary diagonal line from upper outer breast through the nipple area toward lower inner breast.  Direct fluid upward and inward from this line toward the pathway across your upper chest (established in #5).  Do this in three rows to treat all of the upper inner breast tissue, and do each row 3-4x. 8) Then repeat #5 above. 9) Direct fluid to treat all of lower outer breast tissue downward and outward toward pathway established in #6 that is aimed at the left groin. 10)  Then repeat #6 above. 11)  End with repeating #3 and #4 above.   [facility] [address] [place], [place]   [address] [phone] 

## 2020-12-17 ENCOUNTER — Telehealth: Payer: 59 | Admitting: Oncology

## 2020-12-24 NOTE — Progress Notes (Signed)
[place]  [phone] [phone]     ID: Sheila [name] DOB: 02-Feb-1971  MR#: [id number]  VFI#:[id number]  Patient Care Team: Willey Blade, MD as PCP - General (Internal Medicine) Mauro Kaufmann, RN as Oncology Nurse Navigator Rockwell Germany, RN as Oncology Nurse Navigator [name], MD as Consulting Physician (General Surgery) Lile Mccurley, Virgie Dad, MD as Consulting Physician (Oncology) Eppie Gibson, MD as Attending Physician (Radiation Oncology) Lahoma Crocker, MD as Consulting Physician (Obstetrics and Gynecology) Chauncey Cruel, MD OTHER MD:  I attempted to connect with Sheila [name] on 12/26/20 at  3:00 PM EDT by telephone visit but was unable to reach her  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   Since her last visit, she completed radiation therapy under Dr. Isidore Moos on 10/09/2020.  She started tamoxifen on 10/30/2020.   REVIEW OF SYSTEMS: Sheila [name]    COVID 19 VACCINATION STATUS: fully vaccinated [employer]), with booster January /2022   HISTORY OF CURRENT ILLNESS: From the original intake note:  Sheila [name] noted changes in her left nipple sometime around May 2021. She thought it might have been a burn from a hot water bottle or a slight infection. She underwent routine  bilateral diagnostic mammography with tomography at [facility] on 06/15/2020 showing: breast density category B; 2 cm grouped calcifications in upper-outer left breast.  Accordingly on 07/12/2020 she proceeded to biopsy of the left breast area in question. The pathology from this procedure ([id number]) showed: ductal carcinoma in situ, intermediate to high grade, with necrosis and calcifications, focally suspicious for microinvasion. Prognostic indicators significant for: estrogen receptor, 70% positive and  progesterone receptor, 5% positive, both with moderate-strong staining intensity.   The patient's subsequent history is as detailed below.   PAST MEDICAL HISTORY: Past Medical History:  Diagnosis Date  . Asthma    childhood - resolved, none as an adult - no inhaler  . Breast cancer ([place])   . Seasonal allergies    History of uterine fibroids   PAST SURGICAL HISTORY: Past Surgical History:  Procedure Laterality Date  . BREAST LUMPECTOMY WITH RADIOACTIVE SEED LOCALIZATION Left 07/31/2020   Procedure: LEFT BREAST LUMPECTOMY WITH RADIOACTIVE SEED LOCALIZATION;  Surgeon: [name], MD;  Location: [place];  Service: General;  Laterality: Left;  RNFA  . CESAREAN SECTION     x 3     FAMILY HISTORY: No family history on file.  Her father died at age 70 from small bowel obstruction. Her mother is living at age 60 as of 07/2020. Sheila [name] has three brothers (and no sisters). There is no family history of cancer to her knowledge.   GYNECOLOGIC HISTORY:  No LMP recorded. Menarche: 50 years old Age at first live birth: 50 years old [place] P 3 LMP "ongoing" Contraceptive: used for about 10 years HRT n/a  Hysterectomy? no BSO? 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 She is originally from [place], came to the [place] age 22.. Husband Dr. Nolene Ebbs is of course one of our internists locally. 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  She lives at home with husband [name] and their daughter [name], who is 5. Daughter [name], age 54, is an attorney in [place]. Daughter [name], age 14, is a Careers information officer in [place].  ADVANCED DIRECTIVES: In the absence of any documentation to the contrary, the patient's spouse is their HCPOA.    HEALTH MAINTENANCE: Social History   Tobacco Use  . Smoking status: Never Smoker  . Smokeless tobacco: Never Used  Vaping Use  . Vaping Use: Never used  Substance Use  Topics  . Alcohol use: No  . Drug use: No     Colonoscopy: Cologard pending  PAP: 2021  Bone density: never done (age)   No Known Allergies  Current Outpatient Medications  Medication Sig Dispense Refill  . Multiple Vitamin (MULTIVITAMIN WITH MINERALS) TABS tablet Take 1 tablet by mouth at bedtime.    Marland Kitchen oxyCODONE (OXY IR/ROXICODONE) 5 MG immediate release tablet Take 0.5-1 tablets (2.5-5 mg total) by mouth every 6 (six) hours as needed for severe pain. 10 tablet 0   No current facility-administered medications for this visit.    OBJECTIVE: African=American woman who appears younger than stated age  There were no vitals filed for this visit.   There is no height or weight on file to calculate BMI.   Wt Readings from Last 3 Encounters:  09/24/20 172 lb (78 kg)  09/05/20 175 lb 9.6 oz (79.7 kg)  07/31/20 165 lb (74.8 kg)      ECOG FS:1 - Symptomatic but completely ambulatory  Telemedicine visit 12/25/2020   LAB RESULTS:  CMP     Component Value Date/Time   NA 137 07/18/2020 1229   K 3.8 07/18/2020 1229   CL 105 07/18/2020 1229   CO2 25 07/18/2020 1229   GLUCOSE 82 07/18/2020 1229   BUN 8 07/18/2020 1229   CREATININE 0.81 07/18/2020 1229   CALCIUM 9.1 07/18/2020 1229   PROT 7.6 07/18/2020 1229   ALBUMIN 3.7 07/18/2020 1229   AST 20 07/18/2020 1229   ALT 18 07/18/2020 1229   ALKPHOS 47 07/18/2020 1229   BILITOT 0.5 07/18/2020 1229   GFRNONAA >60 07/18/2020 1229    No results found for: TOTALPROTELP, ALBUMINELP, A1GS, A2GS, BETS, BETA2SER, GAMS, MSPIKE, SPEI  Lab Results  Component Value Date   WBC 7.6 07/18/2020   NEUTROABS 4.1 07/18/2020   HGB 10.5 (L) 07/18/2020   HCT 34.8 (L) 07/18/2020   MCV 73.0 (L) 07/18/2020   PLT 380 07/18/2020    No results found for: LABCA2  No components found for: [id number]  No results for input(s): INR in the last 168 hours.  No results found for: LABCA2  No results found for: TML465  No results found for:  KPT465  No results found for: KCL275  No results found for: CA2729  No components found for: HGQUANT  No results found for: CEA1 / No results found for: CEA1   No results found for: AFPTUMOR  No results found for: CHROMOGRNA  No results found for: KPAFRELGTCHN, LAMBDASER, KAPLAMBRATIO (kappa/lambda light chains)  No results found for: HGBA, HGBA2QUANT, HGBFQUANT, HGBSQUAN (Hemoglobinopathy evaluation)   No results found for: LDH  No results found for: IRON, TIBC, IRONPCTSAT (Iron and TIBC)  No results found for: FERRITIN  Urinalysis No results found for: COLORURINE, APPEARANCEUR, LABSPEC, PHURINE, GLUCOSEU, HGBUR, BILIRUBINUR, KETONESUR, PROTEINUR, UROBILINOGEN, NITRITE, LEUKOCYTESUR   STUDIES: No results found.   ELIGIBLE FOR AVAILABLE RESEARCH PROTOCOL: AET  ASSESSMENT: 50 y.o. [place] woman s/p Left breast biopsy 07/12/2020 for ductal carcinoma in situ, grade 2 or 3, estrogen and progesterone receptor positive.  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  I am also setting her up for a virtual video visit as opposed to a phone visit within the next few weeks.  Virgie Dad. Hung Rhinesmith, MD 12/26/2020 11:55 AM Medical Oncology and Hematology [facility] [place], [place] [address] Tel. [phone]    Fax. [phone]   This document serves as a record of services personally  performed by Lurline Del, MD. It was created on his behalf by Wilburn Mylar, a trained medical scribe. The creation of this record is based on the scribe's personal observations and the provider's statements to them.   I, Lurline Del MD, have reviewed the above documentation for accuracy and completeness, and I agree with the above.   *Total Encounter Time as defined by the Centers for Medicare and Medicaid Services includes, in addition to the face-to-face time of a patient visit (documented in the note above) non-face-to-face time: obtaining and reviewing outside history, ordering and reviewing medications, tests or procedures, care coordination (communications with other health care professionals or caregivers) and documentation in the medical record.

## 2020-12-25 ENCOUNTER — Inpatient Hospital Stay: Payer: 59 | Attending: Oncology | Admitting: Oncology

## 2020-12-25 DIAGNOSIS — D0512 Intraductal carcinoma in situ of left breast: Secondary | ICD-10-CM

## 2021-01-21 NOTE — Progress Notes (Signed)
[place]  Telephone:(336) [phone] Fax:(336) [phone]     ID: Sheila [name] DOB: September 21, 1970  MR#: [id number]  XLK#:[id number]  Patient Care Team: Willey Blade, MD as PCP - General (Internal Medicine) Mauro Kaufmann, RN as Oncology Nurse Navigator Rockwell Germany, RN as Oncology Nurse Navigator [name], MD as Consulting Physician (General Surgery) Shantale Holtmeyer, Virgie Dad, MD as Consulting Physician (Oncology) Eppie Gibson, MD as Attending Physician (Radiation Oncology) Lahoma Crocker, MD as Consulting Physician (Obstetrics and Gynecology) Chauncey Cruel, MD OTHER MD:  I phoned  Frutoso Chase A Ripp on 01/22/21 at  3:45 PM EDT but was unable to reach her  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  I left a voicemail for her to call my nurse and let us know how she is doing with tamoxifen and if there are any other issues of concern at this point.   REVIEW OF SYSTEMS: Sheila [name]    COVID 19 VACCINATION STATUS: fully vaccinated [employer]), with booster January /2022   HISTORY OF CURRENT ILLNESS: From the original intake note:  Sheila [name] noted changes in her left nipple sometime around May 2021. She thought it might have been a burn from a hot water bottle or a slight infection. She underwent routine  bilateral diagnostic mammography with tomography at [facility] on 06/15/2020 showing: breast density category B; 2 cm grouped calcifications in upper-outer left breast.  Accordingly on 07/12/2020 she proceeded to biopsy of the left breast area in question. The pathology from this procedure ([id number]) showed: ductal carcinoma in situ, intermediate to high grade, with necrosis and calcifications, focally suspicious for microinvasion. Prognostic indicators significant for: estrogen receptor, 70% positive and progesterone receptor, 5% positive, both with  moderate-strong staining intensity.   The patient's subsequent history is as detailed below.   PAST MEDICAL HISTORY: Past Medical History:  Diagnosis Date  . Asthma    childhood - resolved, none as an adult - no inhaler  . Breast cancer ([place])   . Seasonal allergies    History of uterine fibroids   PAST SURGICAL HISTORY: Past Surgical History:  Procedure Laterality Date  . BREAST LUMPECTOMY WITH RADIOACTIVE SEED LOCALIZATION Left 07/31/2020   Procedure: LEFT BREAST LUMPECTOMY WITH RADIOACTIVE SEED LOCALIZATION;  Surgeon: [name], MD;  Location: [facility];  Service: General;  Laterality: Left;  RNFA  . CESAREAN SECTION     x 3     FAMILY HISTORY: No family history on file.  Her father died at age 24 from small bowel obstruction. Her mother is living at age 38 as of 07/2020. Sheila [name] has three brothers (and no sisters). There is no family history of cancer to her knowledge.   GYNECOLOGIC HISTORY:  No LMP recorded. Menarche: 50 years old Age at first live birth: 50 years old [place] P 3 LMP "ongoing" Contraceptive: used for about 10 years HRT n/a  Hysterectomy? no BSO? 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 She is originally from [place], came to the [place] age 35.. Husband Dr. Nolene Ebbs is of course one of our internists locally. 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  She lives at home with husband Sheila [name] and their daughter Sheila [name], who is 42. Daughter Sheila [name], age 71, is an attorney in [place]. Daughter Sheila [name], age 62, is a Careers information officer in [place].  ADVANCED DIRECTIVES: In the absence of any documentation to the contrary, the patient's spouse is their HCPOA.    HEALTH MAINTENANCE: Social History   Tobacco Use  . Smoking status: Never Smoker  . Smokeless tobacco: Never Used  Vaping Use  . Vaping Use: Never used  Substance Use Topics  . Alcohol use: No  . Drug use: No      Colonoscopy: Cologard pending  PAP: 2021  Bone density: never done (age)   No Known Allergies  Current Outpatient Medications  Medication Sig Dispense Refill  . Multiple Vitamin (MULTIVITAMIN WITH MINERALS) TABS tablet Take 1 tablet by mouth at bedtime.    Marland Kitchen oxyCODONE (OXY IR/ROXICODONE) 5 MG immediate release tablet Take 0.5-1 tablets (2.5-5 mg total) by mouth every 6 (six) hours as needed for severe pain. 10 tablet 0   No current facility-administered medications for this visit.    OBJECTIVE:   There were no vitals filed for this visit.   There is no height or weight on file to calculate BMI.   Wt Readings from Last 3 Encounters:  09/24/20 172 lb (78 kg)  09/05/20 175 lb 9.6 oz (79.7 kg)  07/31/20 165 lb (74.8 kg)      ECOG FS:1 - Symptomatic but completely ambulatory     LAB RESULTS:  CMP     Component Value Date/Time   NA 137 07/18/2020 1229   K 3.8 07/18/2020 1229   CL 105 07/18/2020 1229   CO2 25 07/18/2020 1229   GLUCOSE 82 07/18/2020 1229   BUN 8 07/18/2020 1229   CREATININE 0.81 07/18/2020 1229   CALCIUM 9.1 07/18/2020 1229   PROT 7.6 07/18/2020 1229   ALBUMIN 3.7 07/18/2020 1229   AST 20 07/18/2020 1229   ALT 18 07/18/2020 1229   ALKPHOS 47 07/18/2020 1229   BILITOT 0.5 07/18/2020 1229   GFRNONAA >60 07/18/2020 1229    No results found for: TOTALPROTELP, ALBUMINELP, A1GS, A2GS, BETS, BETA2SER, GAMS, MSPIKE, SPEI  Lab Results  Component Value Date   WBC 7.6 07/18/2020   NEUTROABS 4.1 07/18/2020   HGB 10.5 (L) 07/18/2020   HCT 34.8 (L) 07/18/2020   MCV 73.0 (L) 07/18/2020   PLT 380 07/18/2020    No results found for: LABCA2  No components found for: DGLOVF643  No results for input(s): INR in the last 168 hours.  No results found for: LABCA2  No results found for: PIR518  No results found for: ACZ660  No results found for: YTK160  No results found for: CA2729  No components found for: HGQUANT  No results found for: CEA1 /  No results found for: CEA1   No results found for: AFPTUMOR  No results found for: CHROMOGRNA  No results found for: KPAFRELGTCHN, LAMBDASER, KAPLAMBRATIO (kappa/lambda light chains)  No results found for: HGBA, HGBA2QUANT, HGBFQUANT, HGBSQUAN (Hemoglobinopathy evaluation)   No results found for: LDH  No results found for: IRON, TIBC, IRONPCTSAT (Iron and TIBC)  No results found for: FERRITIN  Urinalysis No results found for: COLORURINE, APPEARANCEUR, LABSPEC, PHURINE, GLUCOSEU, HGBUR, BILIRUBINUR, KETONESUR, PROTEINUR, UROBILINOGEN, NITRITE, LEUKOCYTESUR   STUDIES: No results found.   ELIGIBLE FOR AVAILABLE RESEARCH PROTOCOL: AET  ASSESSMENT: 50 y.o. [place] woman s/p Left breast biopsy 07/12/2020 for ductal carcinoma in situ, grade 2 or 3, estrogen and progesterone receptor positive.  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  I was again unable to connect with her 01/22/2021.  I am going to set her up for an in person visit in July.  Virgie Dad. Liliana Brentlinger, MD 01/22/2021 3:44 PM Medical Oncology and Hematology [facility] [address] [place], [place] [address] Tel. [phone]    Fax. [phone]   This document serves as a record of services personally performed by Lurline Del, MD. It was created on his behalf by Wilburn Mylar, a trained medical scribe. The creation of this record is  based on the scribe's personal observations and the provider's statements to them.   I, Lurline Del MD, have reviewed the above documentation for accuracy and completeness, and I agree with the above.   *Total Encounter Time as defined by the Centers for Medicare and Medicaid Services includes, in addition to the face-to-face time of a patient visit (documented in the note above) non-face-to-face time: obtaining and reviewing outside history, ordering and reviewing medications, tests or procedures, care coordination (communications with other health care professionals or caregivers) and documentation in the medical record.

## 2021-01-22 ENCOUNTER — Inpatient Hospital Stay: Payer: 59 | Attending: Oncology | Admitting: Oncology

## 2021-01-22 DIAGNOSIS — D0512 Intraductal carcinoma in situ of left breast: Secondary | ICD-10-CM

## 2021-01-23 ENCOUNTER — Telehealth: Payer: Self-pay | Admitting: Oncology

## 2021-01-23 NOTE — Telephone Encounter (Signed)
Scheduled appointment per 05/24 los. Left a detailed message.

## 2021-03-21 ENCOUNTER — Telehealth: Payer: Self-pay | Admitting: *Deleted

## 2021-03-21 NOTE — Telephone Encounter (Signed)
This RN returned VM left by the patient stating need to schedule an appt.  Noted pt is scheduled 04/09/2021.  Returned call and obtained VM for pt - message left per above as well to call to this RN if she is having concerns that she wants to be addressed before scheduled appointment.  This RN's name given for return call.

## 2021-04-09 ENCOUNTER — Other Ambulatory Visit: Payer: Self-pay

## 2021-04-09 ENCOUNTER — Inpatient Hospital Stay: Payer: 59 | Attending: Oncology | Admitting: Oncology

## 2021-04-09 VITALS — BP 129/72 | HR 91 | Temp 97.7°F | Resp 18 | Ht 64.0 in | Wt 176.5 lb

## 2021-04-09 DIAGNOSIS — Z923 Personal history of irradiation: Secondary | ICD-10-CM | POA: Diagnosis not present

## 2021-04-09 DIAGNOSIS — Z170 Estrogen receptor positive status [ER+]: Secondary | ICD-10-CM | POA: Insufficient documentation

## 2021-04-09 DIAGNOSIS — D0512 Intraductal carcinoma in situ of left breast: Secondary | ICD-10-CM | POA: Insufficient documentation

## 2021-04-09 NOTE — Progress Notes (Signed)
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  She is accompanied by her daughter [name] (who incidentally suggested I read "blue zones" by Iantha Fallen as part of a discussion on longevity)  The patient was prescribed tamoxifen but has been reading up on possible side effects and had many questions so she has still not started the medication.   REVIEW OF SYSTEMS: Tee still has discomfort in the surgical breast and is concerned that that breast is larger than the other as well as darker.  The skin is somewhat coarse.  There is also some numbness associated with the incisions.  She is concerned that there may be some swelling of the left upper extremity.  She does not have a compression sleeve.  Sleeping on the left side is difficult for her.  A detailed review of systems today was otherwise noncontributory   COVID 19 VACCINATION STATUS: fully vaccinated [employer]), with booster January /2022   HISTORY OF CURRENT ILLNESS: From the original intake note:  [name] noted changes in her left nipple sometime around May 2021. She thought it might have been a burn from a hot water bottle or a slight infection. She underwent routine   bilateral diagnostic mammography with tomography at [facility] on 06/15/2020 showing: breast density category B; 2 cm grouped calcifications in upper-outer left breast.  Accordingly on 07/12/2020 she proceeded to biopsy of the left breast area in question. The pathology from this procedure ([id number]) showed: ductal carcinoma in situ, intermediate to high grade, with necrosis and calcifications, focally suspicious for microinvasion. Prognostic indicators significant for: estrogen receptor, 70% positive and progesterone receptor, 5% positive, both with moderate-strong staining intensity.   The patient's subsequent history is as detailed below.   PAST MEDICAL HISTORY: Past Medical History:  Diagnosis Date   Asthma    childhood - resolved, none as an adult - no inhaler   Breast cancer ([place])    Seasonal allergies    History of uterine fibroids   PAST SURGICAL HISTORY: Past Surgical History:  Procedure Laterality Date   BREAST LUMPECTOMY WITH RADIOACTIVE SEED LOCALIZATION Left 07/31/2020   Procedure: LEFT BREAST LUMPECTOMY WITH RADIOACTIVE SEED LOCALIZATION;  Surgeon: [name], MD;  Location: [place];  Service: General;  Laterality: Left;  RNFA   CESAREAN SECTION     x 3     FAMILY HISTORY: No family history on file.  Her father died at age 25 from small bowel obstruction. Her mother is living at age 59 as of 07/2020. Sheila [name] has three brothers (and no sisters). There is no family history of cancer to her knowledge.   GYNECOLOGIC HISTORY:  No LMP recorded. Menarche: 50 years old Age at first live birth: 50 years old [place] P 3 LMP "ongoing" Contraceptive: used for about 10 years HRT n/a  Hysterectomy? no BSO? 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 She is originally from [place], came to the [place] age 50.. Husband Dr. Nolene Ebbs is of course one of our internists locally. 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  She lives at home with husband Sheila [name] Grief and their daughter Sheila [name], who is 51. Daughter Sheila [name], age 11, is an attorney in [place]. Daughter [name], age 51, has a Oceanographer in psychology and is working in Software engineer, also has a Therapist, sports.    ADVANCED DIRECTIVES: In the absence of any documentation to the contrary, the patient's spouse is their HCPOA.    HEALTH MAINTENANCE: Social History   Tobacco Use   Smoking status: Never   Smokeless tobacco: Never  Vaping Use   Vaping Use: Never used  Substance Use Topics   Alcohol use: No   Drug use: No     Colonoscopy: Cologard pending  PAP: 2021  Bone density: never done (50)   No Known Allergies  Current Outpatient Medications  Medication Sig Dispense Refill   Multiple Vitamin (MULTIVITAMIN WITH MINERALS) TABS tablet Take 1 tablet by mouth at bedtime.     oxyCODONE (OXY IR/ROXICODONE) 5 MG immediate release tablet Take 0.5-1 tablets (2.5-5 mg total) by mouth every 6 (six) hours as needed for severe pain. 10 tablet 0   No current facility-administered medications for this visit.    OBJECTIVE: African-American woman who appears younger than stated age  35:   04/09/21 1543  BP: 129/72  Pulse: 91  Resp: 18  Temp: 97.7 F (36.5 C)  SpO2: 98%     Body mass index is 30.3 kg/m.   Wt Readings from Last 3 Encounters:  04/09/21 176 lb 8 oz (80.1 kg)  09/24/20 172 lb (78 kg)  09/05/20 175 lb 9.6 oz (79.7 kg)      ECOG FS:1 - Symptomatic but completely ambulatory  Sclerae unicteric, EOMs intact Wearing a mask No cervical or supraclavicular adenopathy Lungs no rales or rhonchi Heart regular rate and rhythm Abd soft, nontender, positive bowel sounds MSK no focal spinal tenderness, no upper extremity lymphedema Neuro: nonfocal, well oriented, appropriate affect Breasts: The right breast is benign.  The left breast is slightly larger than the right, there is still hyperpigmentation and some  skin coarsening secondary to the radiation.  There is no evidence of local recurrence.  Both axillae are benign   LAB RESULTS:  CMP     Component Value Date/Time   NA 137 07/18/2020 1229   K 3.8 07/18/2020 1229   CL 105 07/18/2020 1229   CO2 25 07/18/2020 1229   GLUCOSE 82 07/18/2020 1229   BUN 8 07/18/2020 1229   CREATININE 0.81 07/18/2020 1229   CALCIUM 9.1 07/18/2020 1229   PROT 7.6 07/18/2020 1229   ALBUMIN 3.7 07/18/2020 1229   AST 20 07/18/2020 1229   ALT 18 07/18/2020 1229   ALKPHOS 47 07/18/2020 1229   BILITOT 0.5 07/18/2020 1229   GFRNONAA >60 07/18/2020 1229    No results found for: TOTALPROTELP, ALBUMINELP, A1GS, A2GS, BETS, BETA2SER, GAMS, MSPIKE, SPEI  Lab Results  Component Value Date   WBC 7.6 07/18/2020   NEUTROABS 4.1 07/18/2020   HGB 10.5 (L) 07/18/2020   HCT 34.8 (L) 07/18/2020   MCV 73.0 (L) 07/18/2020   PLT 380 07/18/2020    No results found for: LABCA2  No components found for: [id number]  No results for input(s): INR in  the last 168 hours.  No results found for: LABCA2  No results found for: [id number]  No results found for: [id number]  No results found for: [id number]  No results found for: CA2729  No components found for: HGQUANT  No results found for: CEA1 / No results found for: CEA1   No results found for: AFPTUMOR  No results found for: CHROMOGRNA  No results found for: KPAFRELGTCHN, LAMBDASER, KAPLAMBRATIO (kappa/lambda light chains)  No results found for: HGBA, HGBA2QUANT, HGBFQUANT, HGBSQUAN (Hemoglobinopathy evaluation)   No results found for: LDH  No results found for: IRON, TIBC, IRONPCTSAT (Iron and TIBC)  No results found for: FERRITIN  Urinalysis No results found for: COLORURINE, APPEARANCEUR, LABSPEC, PHURINE, GLUCOSEU, HGBUR, BILIRUBINUR, KETONESUR, PROTEINUR, UROBILINOGEN, NITRITE, LEUKOCYTESUR   STUDIES: No results found.   ELIGIBLE FOR AVAILABLE RESEARCH PROTOCOL: AET  ASSESSMENT: 50 y.o. [place]  woman s/p Left breast biopsy 07/12/2020 for ductal carcinoma in situ, grade 2 or 3, estrogen and progesterone receptor positive.  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  She is still having some symptoms related to the local treatment which are bothersome to her.  I wrote her for bras and for a compression sleeve.  We also talked about her going back to physical therapy but she demurs at this point.  We reviewed the use of systemic therapy and noninvasive breast cancer.  Her risk of local recurrence of her ductal carcinoma in situ is certainly less than [id number] at this point.  That is not a strong indicator for antiestrogens.  A better indicator is her risk of developing another breast cancer in the future especially as her plan is to 1100 years and that risk is about 1 %/year.  It is cut in half by antiestrogens  We reviewed the possible toxicities side effects and complications of tamoxifen.  She already has bought the medication although she has not started it.  I suggested she try to make a decision sometime in the next 2 weeks and let me know what her decision is.  Otherwise I am scheduling her for repeat mammography in November of this year and to see me again in December.  Total encounter time 25 minutes.Sarajane Jews C. Tobiah Celestine, MD 04/09/2021 6:42 PM Medical Oncology and Hematology [facility] [place], [place] [address] Tel. [phone]     Fax. [phone]   This document serves as a record of services personally performed by Lurline Del, MD. It was created on his behalf by Wilburn Mylar, a trained medical scribe. The creation of this record is based on the scribe's personal observations and the provider's statements to them.   I, Lurline Del MD, have reviewed the above documentation for accuracy and completeness, and I agree with the above.   *Total Encounter Time as defined by the Centers for Medicare and Medicaid Services includes, in addition to the face-to-face time of a patient visit (documented in the note above) non-face-to-face time: obtaining  and reviewing outside history, ordering and reviewing medications, tests or procedures, care coordination (communications with other health care professionals or caregivers) and documentation in the medical record.

## 2021-04-11 ENCOUNTER — Telehealth: Payer: Self-pay | Admitting: Oncology

## 2021-04-11 NOTE — Telephone Encounter (Signed)
Scheduled appt epr 8/9 los. Faxed orders to [facility]. Called pt, no answer. Left msg with appt date and time.

## 2021-08-05 ENCOUNTER — Encounter: Payer: Self-pay | Admitting: Oncology

## 2021-08-05 ENCOUNTER — Inpatient Hospital Stay: Payer: 59 | Attending: Oncology | Admitting: Oncology

## 2021-08-05 DIAGNOSIS — D0512 Intraductal carcinoma in situ of left breast: Secondary | ICD-10-CM

## 2021-08-05 NOTE — Progress Notes (Signed)
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  The patient was prescribed tamoxifen but has been reading up on possible side effects and had many questions so she has still not started the medication.   REVIEW OF SYSTEMS: Sheila [name]    COVID 19 VACCINATION STATUS: fully vaccinated [employer]), with booster January /2022   HISTORY OF CURRENT ILLNESS: From the original intake note:  Sheila [name] noted changes in her left nipple sometime around May 2021. She thought it might have been a burn from a hot water bottle or a slight infection. She underwent routine  bilateral diagnostic mammography with tomography at [facility] on 06/15/2020 showing: breast density category B; 2 cm grouped calcifications in upper-outer left breast.  Accordingly on 07/12/2020 she proceeded to biopsy of the left breast area in question. The pathology from this procedure ([id number]) showed: ductal carcinoma in situ, intermediate to high grade, with necrosis and calcifications, focally suspicious for microinvasion. Prognostic indicators significant for: estrogen receptor, 70% positive and progesterone receptor, 5% positive, both with moderate-strong staining intensity.    The patient's subsequent history is as detailed below.   PAST MEDICAL HISTORY: Past Medical History:  Diagnosis Date   Asthma    childhood - resolved, none as an adult - no inhaler   Breast cancer ([place])    Seasonal allergies    History of uterine fibroids   PAST SURGICAL HISTORY: Past Surgical History:  Procedure Laterality Date   BREAST LUMPECTOMY WITH RADIOACTIVE SEED LOCALIZATION Left 07/31/2020   Procedure: LEFT BREAST LUMPECTOMY WITH RADIOACTIVE SEED LOCALIZATION;  Surgeon: [name], MD;  Location: [place];  Service: General;  Laterality: Left;  RNFA   CESAREAN SECTION     x 3     FAMILY HISTORY: No family history on file.  Her father died at age 69 from small bowel obstruction. Her mother is living at age 24 as of 07/2020. Sheila [name] has three brothers (and no sisters). There is no family history of cancer to her knowledge.   GYNECOLOGIC HISTORY:  No LMP recorded. Menarche: 50 years old Age at first live birth: 50 years old [place] P 3 LMP "ongoing" Contraceptive: used for about 10 years HRT n/a  Hysterectomy? no BSO? 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 She is originally from [place], came to the [place] age 89.. Husband Dr. Nolene Ebbs is of course one of our internists locally. 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  She lives at home with husband Sheila [name] and their daughter Sheila [name], who is 4. Daughter Sheila [name], age 37, is an attorney in [place]. Daughter Sheila [name], age 15, has a Oceanographer in psychology and is working in Software engineer, also has a Therapist, sports.    ADVANCED DIRECTIVES: In the absence of any documentation to  the contrary, the patient's spouse is their HCPOA.    HEALTH MAINTENANCE: Social History   Tobacco Use   Smoking status: Never   Smokeless tobacco: Never  Vaping Use   Vaping Use: Never used  Substance Use Topics   Alcohol use: No   Drug use: No      Colonoscopy: Cologard pending  PAP: 2021  Bone density: never done (age)   No Known Allergies  Current Outpatient Medications  Medication Sig Dispense Refill   Multiple Vitamin (MULTIVITAMIN WITH MINERALS) TABS tablet Take 1 tablet by mouth at bedtime.     oxyCODONE (OXY IR/ROXICODONE) 5 MG immediate release tablet Take 0.5-1 tablets (2.5-5 mg total) by mouth every 6 (six) hours as needed for severe pain. 10 tablet 0   No current facility-administered medications for this visit.    OBJECTIVE: African-American woman who appears younger than stated age  There were no vitals filed for this visit.    There is no height or weight on file to calculate BMI.   Wt Readings from Last 3 Encounters:  04/09/21 176 lb 8 oz (80.1 kg)  09/24/20 172 lb (78 kg)  09/05/20 175 lb 9.6 oz (79.7 kg)     ECOG FS:1 - Symptomatic but completely ambulatory  Sclerae unicteric, EOMs intact Wearing a mask No cervical or supraclavicular adenopathy Lungs no rales or rhonchi Heart regular rate and rhythm Abd soft, nontender, positive bowel sounds MSK no focal spinal tenderness, no upper extremity lymphedema Neuro: nonfocal, well oriented, appropriate affect Breasts:      LAB RESULTS:  CMP     Component Value Date/Time   NA 137 07/18/2020 1229   K 3.8 07/18/2020 1229   CL 105 07/18/2020 1229   CO2 25 07/18/2020 1229   GLUCOSE 82 07/18/2020 1229   BUN 8 07/18/2020 1229   CREATININE 0.81 07/18/2020 1229   CALCIUM 9.1 07/18/2020 1229   PROT 7.6 07/18/2020 1229   ALBUMIN 3.7 07/18/2020 1229   AST 20 07/18/2020 1229   ALT 18 07/18/2020 1229   ALKPHOS 47 07/18/2020 1229   BILITOT 0.5 07/18/2020 1229   GFRNONAA >60 07/18/2020 1229    No results found for: TOTALPROTELP, ALBUMINELP, A1GS, A2GS, BETS, BETA2SER, GAMS, MSPIKE, SPEI  Lab Results  Component Value Date   WBC 7.6 07/18/2020   NEUTROABS 4.1 07/18/2020   HGB 10.5 (L) 07/18/2020   HCT 34.8 (L) 07/18/2020   MCV 73.0 (L)  07/18/2020   PLT 380 07/18/2020    No results found for: LABCA2  No components found for: [id number]  No results for input(s): INR in the last 168 hours.  No results found for: LABCA2  No results found for: XLK440  No results found for: NUU725  No results found for: DGU440  No results found for: CA2729  No components found for: HGQUANT  No results found for: CEA1 / No results found for: CEA1   No results found for: AFPTUMOR  No results found for: CHROMOGRNA  No results found for: KPAFRELGTCHN, LAMBDASER, KAPLAMBRATIO (kappa/lambda light chains)  No results found for: HGBA, HGBA2QUANT, HGBFQUANT, HGBSQUAN (Hemoglobinopathy evaluation)   No results found for: LDH  No results found for: IRON, TIBC, IRONPCTSAT (Iron and TIBC)  No results found for: FERRITIN  Urinalysis No results found for: COLORURINE, APPEARANCEUR, LABSPEC, PHURINE, GLUCOSEU, HGBUR, BILIRUBINUR, KETONESUR, PROTEINUR, UROBILINOGEN, NITRITE, LEUKOCYTESUR   STUDIES: No results found.   ELIGIBLE FOR AVAILABLE RESEARCH PROTOCOL: AET  ASSESSMENT: 50 y.o. [place] woman s/p Left breast biopsy 07/12/2020 for  ductal carcinoma in situ, grade 2 or 3, estrogen and progesterone receptor positive.  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  She is still having some symptoms related to the local treatment which are bothersome to her.  I wrote her for bras and for a compression sleeve.  We  also talked about her going back to physical therapy but she demurs at this point.  We reviewed the use of systemic therapy and noninvasive breast cancer.  Her risk of local recurrence of her ductal carcinoma in situ is certainly less than 83% at this point.  That is not a strong indicator for antiestrogens.  A better indicator is her risk of developing another breast cancer in the future especially as her plan is to 1100 years and that risk is about 1 %/year.  It is cut in half by antiestrogens  We reviewed the possible toxicities side effects and complications of tamoxifen.  She already has bought the medication although she has not started it.  I suggested she try to make a decision sometime in the next 2 weeks and let me know what her decision is.  Otherwise I am scheduling her for repeat mammography in November of this year and to see me again in December.  Total encounter time 25 minutes.Sarajane Jews C. Magrinat, MD 08/05/2021 8:44 AM Medical Oncology and Hematology [facility] [place], [place] [address] Tel. [phone]    Fax. [phone]   This document serves as a record of services personally performed by Lurline Del, MD. It was created on his behalf by Wilburn Mylar, a trained medical scribe. The creation of this record is based on the scribe's personal observations and the provider's statements to them.   I, Lurline Del MD, have reviewed the above documentation for accuracy and completeness, and I agree with the above.   *Total Encounter Time as defined by the Centers for Medicare and Medicaid Services includes, in addition to the face-to-face time of a patient visit (documented in the note above) non-face-to-face time: obtaining and reviewing outside history, ordering and reviewing medications, tests or procedures, care coordination (communications with other health care professionals or caregivers) and documentation in the medical  record.

## 2021-09-18 NOTE — Progress Notes (Signed)
Patient Care Team: Willey Blade, MD as PCP - General (Internal Medicine) Mauro Kaufmann, RN as Oncology Nurse Navigator Rockwell Germany, RN as Oncology Nurse Navigator [name], MD as Consulting Physician (General Surgery) Magrinat, Virgie Dad, MD as Consulting Physician (Oncology) Eppie Gibson, MD as Attending Physician (Radiation Oncology) Lahoma Crocker, MD as Consulting Physician (Obstetrics and Gynecology)  DIAGNOSIS:    ICD-10-CM   1. Ductal carcinoma in situ (DCIS) of left breast  D05.12       SUMMARY OF ONCOLOGIC HISTORY: Oncology History  Ductal carcinoma in situ (DCIS) of left breast  07/16/2020 Initial Diagnosis    Left breast biopsy 07/12/2020 for ductal carcinoma in situ, grade 2 or 3, estrogen and progesterone receptor positive.   07/31/2020 Surgery   left lumpectomy July 31, 2020 showed ductal carcinoma in situ, high-grade, measuring 2.0 cm, with negative margins   09/12/2020 - 10/09/2020 Radiation Therapy   adjuvant radiation    Anti-estrogen oral therapy   Tamoxifen     CHIEF COMPLIANT: Left breast DCIS  INTERVAL HISTORY: Sheila [name] is a 51 y.o. with above-mentioned history of left breast DCIS. She presents to the clinic today for follow-up.  Follow-up of left breast DCIS on tamoxifen.  She does have hot flashes from tamoxifen but otherwise doing well.  Her major complaint today is left-sided breast pain which has not been improving in spite of being monitored for a while.  ALLERGIES:  has No Known Allergies.  MEDICATIONS:  Current Outpatient Medications  Medication Sig Dispense Refill   Multiple Vitamin (MULTIVITAMIN WITH MINERALS) TABS tablet Take 1 tablet by mouth at bedtime.     oxyCODONE (OXY IR/ROXICODONE) 5 MG immediate release tablet Take 0.5-1 tablets (2.5-5 mg total) by mouth every 6 (six) hours as needed for severe pain. 10 tablet 0   No current facility-administered medications for this visit.    PHYSICAL  EXAMINATION: ECOG PERFORMANCE STATUS: 1 - Symptomatic but completely ambulatory  Vitals:   09/19/21 1037  BP: 132/83  Pulse: 80  Resp: 18  Temp: (!) 97.3 F (36.3 C)  SpO2: 97%   Filed Weights   09/19/21 1037  Weight: 173 lb (78.5 kg)    BREAST: Tenderness and skin discoloration of the left breast scar tissue at the surgical site. (exam performed in the presence of a chaperone)  LABORATORY DATA:  I have reviewed the data as listed CMP Latest Ref Rng & Units 07/18/2020  Glucose 70 - 99 mg/dL 82  BUN 6 - 20 mg/dL 8  Creatinine 0.44 - 1.00 mg/dL 0.81  Sodium 135 - 145 mmol/L 137  Potassium 3.5 - 5.1 mmol/L 3.8  Chloride 98 - 111 mmol/L 105  CO2 22 - 32 mmol/L 25  Calcium 8.9 - 10.3 mg/dL 9.1  Total Protein 6.5 - 8.1 g/dL 7.6  Total Bilirubin 0.3 - 1.2 mg/dL 0.5  Alkaline Phos 38 - 126 U/L 47  AST 15 - 41 U/L 20  ALT 0 - 44 U/L 18    Lab Results  Component Value Date   WBC 7.6 07/18/2020   HGB 10.5 (L) 07/18/2020   HCT 34.8 (L) 07/18/2020   MCV 73.0 (L) 07/18/2020   PLT 380 07/18/2020   NEUTROABS 4.1 07/18/2020    ASSESSMENT & PLAN:  Ductal carcinoma in situ (DCIS) of left breast 07/31/20: left lumpectomy July 31, 2020 showed ductal carcinoma in situ, high-grade, measuring 2.0 cm, with negative margins ER 70%, PR 5%  Adj XRT: completed 10/09/20  Current Treatment:  Tamoxifen started July 2022 Tamoxifen toxicities: Hot flashes Based on the recent clinical trial TAM01, I recommended reducing the dosage of tamoxifen to 10 mg a day.  Breast cancer surveillance: 1. Breast Exam: 09/19/21 2. Mammograms: at [facility]: Because of breast pain she has not done her mammograms. She will try to schedule a mammogram for next month.  Breast pain: Recommended CBD oil and if it does not get better we can consider giving her gabapentin Return to clinic in 1 year for follow-up  No orders of the defined types were placed in this encounter.  The patient has a good understanding  of the overall plan. she agrees with it. she will call with any problems that may develop before the next visit here.  Total time spent: 45 mins including face to face time and time spent for planning, charting and coordination of care  Rulon Eisenmenger, MD, MPH 09/19/2021  I, Thana Ates, am acting as scribe for Dr. Nicholas Lose.  I have reviewed the above documentation for accuracy and completeness, and I agree with the above.

## 2021-09-18 NOTE — Assessment & Plan Note (Signed)
07/31/20: left lumpectomy July 31, 2020 showed ductal carcinoma in situ, high-grade, measuring 2.0 cm, with negative margins ER 70%, PR 5%  Adj XRT: completed 10/09/20  Current Treatment: Tamoxifen  Breast cancer surveillance: 1. Breast Exam: 09/19/21 2. Mammograms: at [facility]

## 2021-09-19 ENCOUNTER — Other Ambulatory Visit: Payer: Self-pay

## 2021-09-19 ENCOUNTER — Inpatient Hospital Stay: Payer: 59 | Attending: Hematology and Oncology | Admitting: Hematology and Oncology

## 2021-09-19 DIAGNOSIS — Z923 Personal history of irradiation: Secondary | ICD-10-CM | POA: Diagnosis not present

## 2021-09-19 DIAGNOSIS — D0512 Intraductal carcinoma in situ of left breast: Secondary | ICD-10-CM | POA: Diagnosis present

## 2021-09-19 DIAGNOSIS — Z170 Estrogen receptor positive status [ER+]: Secondary | ICD-10-CM | POA: Diagnosis not present

## 2021-09-19 DIAGNOSIS — Z79810 Long term (current) use of selective estrogen receptor modulators (SERMs): Secondary | ICD-10-CM | POA: Diagnosis not present

## 2021-09-19 MED ORDER — TAMOXIFEN CITRATE 20 MG PO TABS: 10.0000 mg | ORAL_TABLET | Freq: Every day | ORAL | 3 refills | Status: DC

## 2021-10-15 ENCOUNTER — Encounter (HOSPITAL_COMMUNITY): Payer: Self-pay

## 2021-11-04 ENCOUNTER — Encounter: Payer: Self-pay | Admitting: Hematology and Oncology

## 2022-05-28 ENCOUNTER — Other Ambulatory Visit: Payer: Self-pay | Admitting: Internal Medicine

## 2022-05-28 DIAGNOSIS — Z1231 Encounter for screening mammogram for malignant neoplasm of breast: Secondary | ICD-10-CM

## 2022-09-23 ENCOUNTER — Inpatient Hospital Stay: Payer: 59 | Attending: Hematology and Oncology | Admitting: Hematology and Oncology

## 2022-09-23 ENCOUNTER — Other Ambulatory Visit: Payer: Self-pay

## 2022-09-23 VITALS — BP 136/87 | HR 77 | Temp 97.2°F | Resp 18 | Wt 185.4 lb

## 2022-09-23 DIAGNOSIS — Z923 Personal history of irradiation: Secondary | ICD-10-CM | POA: Insufficient documentation

## 2022-09-23 DIAGNOSIS — D0512 Intraductal carcinoma in situ of left breast: Secondary | ICD-10-CM

## 2022-09-23 DIAGNOSIS — Z79810 Long term (current) use of selective estrogen receptor modulators (SERMs): Secondary | ICD-10-CM | POA: Insufficient documentation

## 2022-09-23 NOTE — Progress Notes (Signed)
Patient Care Team: Willey Blade, MD as PCP - General (Internal Medicine) Mauro Kaufmann, RN as Oncology Nurse Navigator Rockwell Germany, RN as Oncology Nurse Navigator [name], MD as Consulting Physician (General Surgery) Magrinat, Virgie Dad, MD (Inactive) as Consulting Physician (Oncology) Eppie Gibson, MD as Attending Physician (Radiation Oncology) Lahoma Crocker, MD as Consulting Physician (Obstetrics and Gynecology)  DIAGNOSIS:  Encounter Diagnosis  Name Primary?   Ductal carcinoma in situ (DCIS) of left breast Yes    SUMMARY OF ONCOLOGIC HISTORY: Oncology History  Ductal carcinoma in situ (DCIS) of left breast  07/16/2020 Initial Diagnosis    Left breast biopsy 07/12/2020 for ductal carcinoma in situ, grade 2 or 3, estrogen and progesterone receptor positive.   07/31/2020 Surgery   left lumpectomy July 31, 2020 showed ductal carcinoma in situ, high-grade, measuring 2.0 cm, with negative margins   09/12/2020 - 10/09/2020 Radiation Therapy   adjuvant radiation    Anti-estrogen oral therapy   Tamoxifen     CHIEF COMPLIANT: Left breast DCIS   INTERVAL HISTORY: Sheila [name] is a 52 y.o. with above-mentioned history of left breast DCIS. She presents to the clinic today for follow-up. She is tolerating the tamoxifen she does have severe hot flashes, she denies muscle cramps. She denies any pain or discomfort in breast. Her only complaint was she still have the swelling under the left axilla and tenderness.   ALLERGIES:  has No Known Allergies.  MEDICATIONS:  Current Outpatient Medications  Medication Sig Dispense Refill   Multiple Vitamin (MULTIVITAMIN WITH MINERALS) TABS tablet Take 1 tablet by mouth at bedtime.     tamoxifen (NOLVADEX) 20 MG tablet Take 0.5 tablets (10 mg total) by mouth daily. 90 tablet 3   No current facility-administered medications for this visit.    PHYSICAL EXAMINATION: ECOG PERFORMANCE STATUS: 1 - Symptomatic but  completely ambulatory  Vitals:   09/23/22 1158  BP: 136/87  Pulse: 77  Resp: 18  Temp: (!) 97.2 F (36.2 C)  SpO2: 95%   Filed Weights   09/23/22 1158  Weight: 185 lb 7 oz (84.1 kg)    BREAST: No palpable masses or nodules in either right or left breasts. No palpable axillary supraclavicular or infraclavicular adenopathy no breast tenderness or nipple discharge. (exam performed in the presence of a chaperone)  LABORATORY DATA:  I have reviewed the data as listed    Latest Ref Rng & Units 07/18/2020   12:29 PM  CMP  Glucose 70 - 99 mg/dL 82   BUN 6 - 20 mg/dL 8   Creatinine 0.44 - 1.00 mg/dL 0.81   Sodium 135 - 145 mmol/L 137   Potassium 3.5 - 5.1 mmol/L 3.8   Chloride 98 - 111 mmol/L 105   CO2 22 - 32 mmol/L 25   Calcium 8.9 - 10.3 mg/dL 9.1   Total Protein 6.5 - 8.1 g/dL 7.6   Total Bilirubin 0.3 - 1.2 mg/dL 0.5   Alkaline Phos 38 - 126 U/L 47   AST 15 - 41 U/L 20   ALT 0 - 44 U/L 18     Lab Results  Component Value Date   WBC 7.6 07/18/2020   HGB 10.5 (L) 07/18/2020   HCT 34.8 (L) 07/18/2020   MCV 73.0 (L) 07/18/2020   PLT 380 07/18/2020   NEUTROABS 4.1 07/18/2020    ASSESSMENT & PLAN:  Ductal carcinoma in situ (DCIS) of left breast 07/31/20: left lumpectomy July 31, 2020 showed ductal carcinoma in situ, high-grade,  measuring 2.0 cm, with negative margins ER 70%, PR 5%   Adj XRT: completed 10/09/20   Current Treatment: Tamoxifen started July 2022 Tamoxifen toxicities: Hot flashes Based on the recent clinical trial TAM01, I recommended reducing the dosage of tamoxifen to 10 mg a day.   Breast cancer surveillance: 1. Breast Exam: 09/23/22 2. Mammograms:11/04/21 at [facility]: Benign   Breast pain: Recommended CBD oil and if it does not get better we can consider giving her gabapentin Return to clinic in 1 year for follow-up    Orders Placed This Encounter  Procedures   Ambulatory referral to Physical Therapy    Referral Priority:   Routine     Referral Type:   Physical Medicine    Referral Reason:   Specialty Services Required    Requested Specialty:   Physical Therapy    Number of Visits Requested:   1   The patient has a good understanding of the overall plan. she agrees with it. she will call with any problems that may develop before the next visit here. Total time spent: 30 mins including face to face time and time spent for planning, charting and co-ordination of care   Harriette Ohara, MD 09/23/22    I Gardiner Coins am acting as a Education administrator for [employer]  I have reviewed the above documentation for accuracy and completeness, and I agree with the above.

## 2022-09-23 NOTE — Assessment & Plan Note (Signed)
07/31/20: left lumpectomy July 31, 2020 showed ductal carcinoma in situ, high-grade, measuring 2.0 cm, with negative margins ER 70%, PR 5%   Adj XRT: completed 10/09/20   Current Treatment: Tamoxifen started July 2022 Tamoxifen toxicities: Hot flashes Based on the recent clinical trial TAM01, I recommended reducing the dosage of tamoxifen to 10 mg a day.   Breast cancer surveillance: 1. Breast Exam: 09/23/22 2. Mammograms:11/04/21 at [facility]: Benign   Breast pain: Recommended CBD oil and if it does not get better we can consider giving her gabapentin Return to clinic in 1 year for follow-up

## 2023-09-24 ENCOUNTER — Inpatient Hospital Stay: Payer: 59 | Attending: Hematology and Oncology | Admitting: Hematology and Oncology

## 2023-09-24 VITALS — BP 141/82 | HR 87 | Temp 97.4°F | Resp 18 | Ht 64.0 in | Wt 185.9 lb

## 2023-09-24 DIAGNOSIS — Z79810 Long term (current) use of selective estrogen receptor modulators (SERMs): Secondary | ICD-10-CM | POA: Insufficient documentation

## 2023-09-24 DIAGNOSIS — Z923 Personal history of irradiation: Secondary | ICD-10-CM | POA: Insufficient documentation

## 2023-09-24 DIAGNOSIS — D0512 Intraductal carcinoma in situ of left breast: Secondary | ICD-10-CM | POA: Insufficient documentation

## 2023-09-24 MED ORDER — TAMOXIFEN CITRATE 10 MG PO TABS: 5.0000 mg | ORAL_TABLET | Freq: Every day | ORAL | Status: AC

## 2023-09-24 NOTE — Progress Notes (Signed)
Patient Care Team: Andi Devon, MD as PCP - General (Internal Medicine) Pershing Proud, RN as Oncology Nurse Navigator Donnelly Angelica, RN as Oncology Nurse Navigator Almond Lint, MD as Consulting Physician (General Surgery) Magrinat, Valentino Hue, MD (Inactive) as Consulting Physician (Oncology) Lonie Peak, MD as Attending Physician (Radiation Oncology) Antionette Char, MD as Consulting Physician (Obstetrics and Gynecology)  DIAGNOSIS:  Encounter Diagnosis  Name Primary?   Ductal carcinoma in situ (DCIS) of left breast Yes    SUMMARY OF ONCOLOGIC HISTORY: Oncology History  Ductal carcinoma in situ (DCIS) of left breast  07/16/2020 Initial Diagnosis    Left breast biopsy 07/12/2020 for ductal carcinoma in situ, grade 2 or 3, estrogen and progesterone receptor positive.   07/31/2020 Surgery   left lumpectomy July 31, 2020 showed ductal carcinoma in situ, high-grade, measuring 2.0 cm, with negative margins   08/08/2020 Genetic Testing   Negative genetic testing: no pathogenic variants detected in [facility] Common Hereditary Cancers Panel.  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  The report date is August 08, 2020.   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  Report date is 02/16/2023.     The Common Hereditary Cancers Panel offered by [facility] includes sequencing and/or deletion duplication testing of the following 48 genes: APC, ATM, AXIN2, BARD1, BMPR1A, BRCA1, BRCA2, BRIP1, CDH1, CDK4, CDKN2A (p14ARF), CDKN2A (p16INK4a), CHEK2, CTNNA1, DICER1, EPCAM (Deletion/duplication testing only), GREM1 (promoter region deletion/duplication testing only), KIT, MEN1, MLH1, MSH2, MSH3, MSH6, MUTYH, NBN, NF1, NHTL1, PALB2, PDGFRA, PMS2, POLD1, POLE, PTEN, RAD50, RAD51C, RAD51D, RNF43, SDHB, SDHC, SDHD, SMAD4, SMARCA4. STK11, TP53, TSC1, TSC2, and VHL.  The following genes were evaluated for sequence changes  only: SDHA and HOXB13 c.251G>A variant only.   09/12/2020 - 10/09/2020 Radiation Therapy   adjuvant radiation    Anti-estrogen oral therapy   Tamoxifen     CHIEF COMPLIANT: Follow-up on tamoxifen, nonhealing left breast wound  HISTORY OF PRESENT ILLNESS:   History of Present Illness   The patient, a breast cancer survivor in her fourth year post-diagnosis, presents with severe hot flashes occurring multiple times daily. She is currently on a 10mg  dose of tamoxifen, which was reduced from a previous higher dose due to the severity of the hot flashes.  In addition to the hot flashes, the patient has an open wound on the side of her body that has not healed for the past two months. The wound was initially diagnosed as a fungal infection and has been treated with various antibiotics without success. The patient reports that the wound is not painful but does itch.         ALLERGIES:  has no known allergies.  MEDICATIONS:  Current Outpatient Medications  Medication Sig Dispense Refill   Multiple Vitamin (MULTIVITAMIN WITH MINERALS) TABS tablet Take 1 tablet by mouth at bedtime.     tamoxifen (NOLVADEX) 10 MG tablet Take 0.5 tablets (5 mg total) by mouth daily.     No current facility-administered medications for this visit.    PHYSICAL EXAMINATION: ECOG PERFORMANCE STATUS: 1 - Symptomatic but completely ambulatory  Vitals:   09/24/23 1122  BP: (!) 141/82  Pulse: 87  Resp: 18  Temp: (!) 97.4 F (36.3 C)  SpO2: 100%   Filed Weights   09/24/23 1122  Weight: 185 lb 14.4 oz (84.3 kg)    Physical Exam   SKIN: Open wound on breast, not healing, persistent for two months, described as sore  and itchy.      (exam performed in the presence of a chaperone)  LABORATORY DATA:  I have reviewed the data as listed    Latest Ref Rng & Units 07/18/2020   12:29 PM  CMP  Glucose 70 - 99 mg/dL 82   BUN 6 - 20 mg/dL 8   Creatinine 1.61 - 0.96 mg/dL 0.45   Sodium 409 - 811 mmol/L 137    Potassium 3.5 - 5.1 mmol/L 3.8   Chloride 98 - 111 mmol/L 105   CO2 22 - 32 mmol/L 25   Calcium 8.9 - 10.3 mg/dL 9.1   Total Protein 6.5 - 8.1 g/dL 7.6   Total Bilirubin 0.3 - 1.2 mg/dL 0.5   Alkaline Phos 38 - 126 U/L 47   AST 15 - 41 U/L 20   ALT 0 - 44 U/L 18     Lab Results  Component Value Date   WBC 7.6 07/18/2020   HGB 10.5 (L) 07/18/2020   HCT 34.8 (L) 07/18/2020   MCV 73.0 (L) 07/18/2020   PLT 380 07/18/2020   NEUTROABS 4.1 07/18/2020    ASSESSMENT & PLAN:  Ductal carcinoma in situ (DCIS) of left breast 07/31/20: left lumpectomy July 31, 2020 showed ductal carcinoma in situ, high-grade, measuring 2.0 cm, with negative margins ER 70%, PR 5%   Adj XRT: completed 10/09/20   Current Treatment: Tamoxifen started July 2022 Tamoxifen toxicities: Hot flashes Based on the recent clinical trial TAM01, I recommended reducing the dosage of tamoxifen to 10 mg a day.   Breast cancer surveillance: 1. Breast Exam: 09/24/2023 2. Mammograms:11/10/2022 at [facility]: Benign, breast density category B   Breast pain: Recommended CBD oil and if it does not get better we can consider giving her gabapentin Return to clinic in 1 year for follow-up ------------------------------------- Assessment and Plan    Breast Cancer In the fourth year post-diagnosis, currently on Tamoxifen 10mg  with severe hot flashes. -Reduce Tamoxifen dose to 5mg  daily to potentially decrease hot flash severity. -Plan to continue Tamoxifen for two more years.  Non-healing wound Open wound present for the last two months, previously diagnosed as a fungal infection. -Contacted Dr. Donell Beers, the surgeon, for further evaluation and management. -Continue current wound care regimen until seen by Dr. Donell Beers.  General Health Maintenance -Next mammogram due in March at [facility]. -Follow-up appointment in one year, or sooner if needed based on Dr. Arita Miss recommendations.          No orders of the defined types  were placed in this encounter.  The patient has a good understanding of the overall plan. she agrees with it. she will call with any problems that may develop before the next visit here. Total time spent: 30 mins including face to face time and time spent for planning, charting and co-ordination of care   Tamsen Meek, MD 09/24/23

## 2023-09-24 NOTE — Assessment & Plan Note (Signed)
07/31/20: left lumpectomy July 31, 2020 showed ductal carcinoma in situ, high-grade, measuring 2.0 cm, with negative margins ER 70%, PR 5%   Adj XRT: completed 10/09/20   Current Treatment: Tamoxifen started July 2022 Tamoxifen toxicities: Hot flashes Based on the recent clinical trial TAM01, I recommended reducing the dosage of tamoxifen to 10 mg a day.   Breast cancer surveillance: 1. Breast Exam: 09/24/2023 2. Mammograms:11/10/2022 at [facility]: Benign, breast density category B   Breast pain: Recommended CBD oil and if it does not get better we can consider giving her gabapentin Return to clinic in 1 year for follow-up

## 2024-01-05 ENCOUNTER — Encounter: Payer: Self-pay | Admitting: Hematology and Oncology

## 2024-02-22 ENCOUNTER — Encounter: Payer: Self-pay | Admitting: Hematology and Oncology

## 2024-03-21 ENCOUNTER — Telehealth: Payer: Self-pay | Admitting: General Surgery

## 2024-03-21 LAB — SURGICAL PATHOLOGY

## 2024-03-22 ENCOUNTER — Ambulatory Visit: Payer: Self-pay | Admitting: General Surgery

## 2024-03-22 NOTE — Telephone Encounter (Signed)
 Punch biopsy showed paget's disease.    It has date of 08/30/1898 so is showing up at the bottom of the path results.    Left message on machine.  Contacted Dr. Gudena.  Have left instructions with my office staff regarding times I can make appointment for follow up to discuss.

## 2024-04-13 ENCOUNTER — Other Ambulatory Visit: Payer: Self-pay

## 2024-04-14 LAB — SURGICAL PATHOLOGY

## 2024-04-18 ENCOUNTER — Encounter: Payer: Self-pay | Admitting: Hematology and Oncology

## 2024-04-20 ENCOUNTER — Other Ambulatory Visit: Payer: Self-pay | Admitting: Hematology and Oncology

## 2024-04-20 DIAGNOSIS — C50912 Malignant neoplasm of unspecified site of left female breast: Secondary | ICD-10-CM

## 2024-04-20 NOTE — Progress Notes (Signed)
 Recurrent left breast cancer: Recent mammogram revealed a 5 to 9 cm area of distortion which on biopsy came back as grade 2 IDC with DCIS and comedonecrosis ER 50% PR 0% Ki67 30% HER2 3+ positive. We presented her case in the multidiscipline tumor board and the pathologist noted that the bulk of the pathology is DCIS and a small focus of invasive cancer was noted.  Dr. Aron recommended a mastectomy. Patient and her husband wish to see surgeon at [facility]. I agree with Dr. Dia recommendation that she will need a mastectomy. Depending on the final pathology she will need adjuvant chemotherapy. I discussed with them that if the final tumor size is greater than half a centimeter, she will need chemotherapy with anti-HER2 therapy.  Plan: CT CAP for staging Follow-up after surgery to discuss pathology report and determine the adjuvant treatment plan.

## 2024-04-21 ENCOUNTER — Encounter: Payer: Self-pay | Admitting: Hematology and Oncology

## 2024-04-25 ENCOUNTER — Encounter (HOSPITAL_COMMUNITY): Payer: Self-pay

## 2024-04-25 ENCOUNTER — Ambulatory Visit (HOSPITAL_COMMUNITY)
Admission: RE | Admit: 2024-04-25 | Discharge: 2024-04-25 | Disposition: A | Discharge status: Home / Self Care | Source: Ambulatory Visit | Attending: Hematology and Oncology | Admitting: Hematology and Oncology

## 2024-04-25 DIAGNOSIS — C50912 Malignant neoplasm of unspecified site of left female breast: Secondary | ICD-10-CM | POA: Diagnosis present

## 2024-04-25 MED ORDER — IOHEXOL 300 MG/ML  SOLN
100.0000 mL | Freq: Once | INTRAMUSCULAR | Status: AC | PRN
  Administered 2024-04-25: 100 mL via INTRAVENOUS

## 2024-04-28 ENCOUNTER — Encounter: Payer: Self-pay | Admitting: Hematology and Oncology

## 2024-05-03 ENCOUNTER — Ambulatory Visit: Admitting: Adult Health

## 2024-05-05 ENCOUNTER — Encounter: Payer: Self-pay | Admitting: *Deleted

## 2024-05-06 NOTE — Progress Notes (Addendum)
 Breast Clinic New Consultation Evaluation Medical Oncology   Identifying Statement: Sheila [name] [id number] is a 53 y.o. female from [place] [address] seen for invasive breast cancer.  Physician Requesting Consultation: Aron Jina Mckusick, * Patient Care Team: Theo Suzen Kubas, MD as PCP - General (Internal Medicine) Odean Mackey POUR, MD as Referring Physician (Hematology and Oncology) Aron Jina Mckusick, MD as Consulting Provider (Surgical Oncology) Izell Lauraine Norris, MD (Radiation Oncology)   Oncologic History:  Oncology History Overview Note  L breast IDC, area of calcs and non-mass enhancement - 3mm on biopsy, grade 2, ER 50%, PR 0%, HER2 3+  Diagnosis/Treatment: (Dr. 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  04/13/24 - contrast enhanced mammogram - area concerning for calcifications and non mass enhancement in L breast, US  axilla negative 04/13/24 - IDC grade 2, DCIS on bx of calcs  05/06/24 - seen at [facility] - [place] - discussion re consideration for upfront surgery then adjuvant therapy, possibly MRI breast to better determine extent of disease, if c/w stage I recommendation for adjuvant TH, would also switch ET from tamoxifen  to AI (OFS pending menopausal status)  [facility] [place] -   Genetics Done 2021, negative - VUS  Relevant hx ?perimenopausal - states menses stopped once started tamoxifen  in 2021, plan to check FSH/estradiol once off tamoxifen    Malignant neoplasm of upper-outer quadrant of left breast in female, estrogen receptor positive (CMS/HHS-HCC)  03/14/2024 Initial Diagnosis   Malignant neoplasm of upper-outer quadrant of left  breast in female, estrogen receptor positive (CMS/HHS-HCC)   Recurrent breast cancer, left (CMS/HHS-HCC)  2021 Clinical Event-Other   Left breast DCIS (ER/PR+) 07/31/2020 Lumpectomy, high grade, measuring 2.0 cm, with negative margins Adjuvant radiation therapy 40.05 GY Tamoxifen    07/18/2020 Genetic Counseling      03/14/2024 Biopsy   1. Soft tissue, biopsy, Left breast :       PAGET'S DISEASE, SEE DESCRIPTION.   Sheila [name]   04/2024 Initial Diagnosis   Recurrent breast cancer, left (CMS/HHS-HCC)   04/13/2024 Biopsy   1. Breast, left, needle core biopsy, 3 o'clock :       INVASIVE DUCTAL CARCINOMA, SEE NOTE       DUCTAL CARCINOMA IN SITU, COMEDO, HIGH NUCLEAR GRADE, WITH NECROSIS       TUBULE FORMATION: SCORE 3       NUCLEAR PLEOMORPHISM: SCORE 2       MITOTIC COUNT: SCORE 1       TOTAL SCORE: 6       OVERALL GRADE: 2       LYMPHOVASCULAR INVASION: NOT IDENTIFIED       CANCER LENGTH: 0.3 CM, INVASIVE       CALCIFICATIONS: PRESENT IN DCIS       OTHER FINDINGS: NONE   Estrogen positive (50%), Progesterone negative, HER-2 positive  [place]      History of Present Illness:  Sheila [name] is a very pleasant 53 y.o. woman who presents in consultation for recently diagnosed breast cancer. As above work-up thus far has involved diagnostic imaging and biopsy c/w invasive breast cancer.  History of  Present Illness Sheila [name] is a 53 year old female with a history of breast cancer who presents for evaluation of recurrent breast cancer symptoms. She is accompanied by her husband, who is an Administrator, arts. She was referred by her local medical team in [place] for further evaluation and management at [facility].  In early 2025, she experienced a recurrence of breast irritation and itching. A biopsy revealed ductal carcinoma in situ (DCIS) and some invasive ductal carcinoma. A CT-enhanced mammogram confirmed the presence of calcifications and invasive disease.  She was  initially diagnosed with breast cancer in 2021 after presenting with a rash and irritation on her breast. A biopsy revealed fungal dermatitis, and a subsequent mammogram showed calcifications, leading to a diagnosis of DCIS. She underwent a lumpectomy on July 31, 2020, followed by radiation therapy completed in February 2022. She has been on tamoxifen  since then.  She has a history of a rash and irritation post-radiation, treated with antifungal and steroid creams, resulting in hypopigmentation but no active rash or bleeding. The rash reappeared at the beginning of 2025, prompting further investigation.  She is currently taking tamoxifen  and has not taken any estrogen replacement therapy. She maintains an active lifestyle, walking and swimming regularly.   Review of Systems: She denies any fevers, chills, nausea, vomiting, diarrhea or constipation. She denies headaches or dizziness.  She has no unusual numbness and denies focal weakness.  She denies shortness of breath, cough, lower extremity swelling.  Review of systems is otherwise unremarkable.   Gynecologic History:   4 children Menopause: stopped in 2021 when started tamoxifen  - ?perimenopausal  HRT: no  Past Medical History: No past medical history on file.   Past Surgical History: Past Surgical History:  Procedure Laterality Date  . LEFT BREAST LUMPECTOMY WITH RADIOACTIVE SEED LOCALIZATION  07/31/2020   Dr. Aron    Family History: No family history on file.   Social History: Social History   Socioeconomic History  . Marital status: Married  Tobacco Use  . Smoking status: Never  . Smokeless tobacco: Never  Vaping Use  . Vaping status: Never Used  Substance and Sexual Activity  . Drug use: Never   Social Drivers of Health   Housing Stability: Unknown (03/14/2024)   Housing Stability Vital Sign   . Homeless in the Last Year: No     Psycho-Social History: Social History   Socioeconomic History  . Marital  status: Married  Tobacco Use  . Smoking status: Never  . Smokeless tobacco: Never  Vaping Use  . Vaping status: Never Used  Substance and Sexual Activity  . Drug use: Never   Social Drivers of Health   Housing Stability: Unknown (03/14/2024)   Housing Stability Vital Sign   . Homeless in the Last Year: No    Allergies: No Known Allergies   Current Medications: Current Outpatient Medications  Medication Sig Dispense Refill  . betamethasone dipropionate (DIPROSONE) 0.05 % cream Apply topically 2 (two) times daily 15 g 0  . multivitamin-Ca-iron-minerals Tab Take 1 tablet by mouth    . tamoxifen  (NOLVADEX ) 10 MG tablet Take 5 mg by mouth once daily     No current facility-administered medications for this visit.      Objective:    Physical Exam: BP 126/76 (BP Location: Left upper arm, Patient Position: Sitting, BP Cuff Size: Adult)   Pulse 70   Temp 36.6 C (97.8 F) (Oral)   Resp 18   Wt 84.8 kg (186 lb 14.4 oz)   SpO2  97%   BMI 32.08 kg/m Body surface area is 1.96 meters squared.  ECOG Performance Status: 0-1 General appearance: Appears well, in no acute distress.  HEENT: PERRLA, EOMI, sclerae anicteric.  Oropharynx clear without lesions, exudates, or ulceration.  Lymph Nodes: No cervical, supraclavicular or axillary lymphadenopathy. Breast:   Left - symmetrical, no masses, area of rash/open skin wound over L breast at least 5cm    Right - symmetrical, no masses, skin changes or nipple discharge  Cardiac:   RRR, without murmur, rub, or gallop.  Lungs: Breath sounds are clear to auscultation and percussion. Abdomen/GI: Bowel sounds present. Soft, non-tender, and non-distended. No hepatosplenomegaly. No palpable abdominal masses. Extremities: No clubbing, cyanosis, or edema. Skin: Color and texture normal. No rashes or lesions.  Neurologic: Alert and oriented x 3, normal strength and tone.    Laboratory:    Chemistry   No results found for: NA, K, CL,  CO2, BUN, CREATININE, GLUCOSE No results found for: CALCIUM, ALKPHOS, AST, ALT, TBILI     Last CBC and neutrophil count: No results found for: HGBNo results found for: HCTNo results found for: PLTNo results found for: [facility] results found for: NEUTOPHILPCTNo results found for: NEUTCT   Imaging:    Results LABS Estrogen receptor: 50% Progesterone receptor: 0% HER2: 3  RADIOLOGY Mammogram: Calcifications, DCIS Ultrasound of axilla: Negative CT scan: Negative  PATHOLOGY Biopsy: Fungal dermatitis Biopsy: DCIS and invasive ductal carcinoma  Results LABS Estrogen receptor: 50% Progesterone receptor: 0% HER2: 3  RADIOLOGY Mammogram: Calcifications, DCIS Ultrasound of axilla: Negative CT scan: Negative  PATHOLOGY Biopsy: Fungal dermatitis Biopsy: DCIS and invasive ductal carcinoma  Imaging, labs and pathology personally reviewed and discussed with patient.  Impression/Plan:   Sheila [name] is a very pleasant 53 y.o. woman w/ breast cancer as above who presents in consultation for consideration of systemic therapy.  #Invasive Breast Cancer, stage I, ER/PR+ HER2+   I reviewed the implications of the diagnosis at length today in clinic with the patient. I reviewed the patient's imaging, pathology, and disease course thus far with her. I explained the basic biology of breast cancer, the significance of her biomarkers, and the anticipated management going forward.   Discussed the benefit of neoadjuvant therapy in the setting of HER2 positive breast cancer. NACT is considered in patients with earlier stage disease II or III as NACT may allow for breast-conserving surgery (BCS), if pt has limited axillary nodal involvement (N1) may prevent need for axillary lymph node dissection and allow pt to be candidate for SLNBx, waiting for surgery or for consideration of use of T-DM1 if there was residual disease. Standard regimens include TCH(P) with  docetaxel/carboplatin/trastuzumab +/- pertuzumab vs weekly paclitaxel/carboplatin and trastuzumab/pertuzumab. These regimens are preferred as they avoid anthracyclines and studies have shown similar pCR rates w/o need for anthracycling. Pertuzuamb is considered for those with node positive disease or tumors > 2cm as pertuzumab can enhance locoregional response however it does add toxicity such as diarrhea and hematologic toxicities thus if pt has significant comorbidity or low-risk (clinical stage I-IIA) could consider forgoing. For patients with low-risk, HER2 positive tumors (clinical stage I, T1N0) can consider NACT with weekly paclitaxel with trastuzumab for 12 weeks. Another consideration based on pre-lim results of DAPHNE trial (evaluated pts with stage II-III HER2+ breast cancer, most were stage II) and received weekly paclitaxel x12 doses, and HP every 3 weeks x4 doses (up to 6 doses allowed in case of surgical delay) with overall pCR of 55%. Could be considered  for pts w/ concern in received TCHP or TCH.   Discussed the benefit of adjuvant therapy in the setting of HER2+ disease. Recommendation for pts with T1b, T1c and select T1a tumors is for adjuvant therapy with paclitaxel (80mg /m2) weekly for 12 weeks plus trastuzumab for 1 year ([place] NEJM 2015) as this study showed 3-year survival from invasive disease of 98,7%, 3-year rate of recurrence free survival of 99.2% with 7 year outcomes showing DFS of 93% and OS of 95%. Overall toxicities minimal - neuropathy, LV dysfunction. Studies have shown worse 5-year rates of recurrence-free survival for HER2+ tumors T1a or T1b compared to HER2 - disease (77% vs 94%) and distant recurrence-free survival (86% vs 97%).   - reviewed prior treatment history and recent diagnosis as above - discussed based on current imaging seems area of disease mostly c/w calcifications and non-mass enhancement which could represent DCIS compared to invasive disease, pending our  radiology review - on biopsy component of invasive disease was 3mm, discussed would discuss w/ Dr. Elodie regarding if need for more imaging such as breast MRI to better determine extent - at this point, may be beneficial for upfront surgery to determine extent of disease and role for adjuvant therapy - reviewed recommendation for adjuvant chemotherapy and anti-HER2 therapies I/s/o HER2+ disease if tumor > 5mm  - discussed situations in which NACT is considered if concern for tumor > 2cm or LN+ disease  - would recommend stopping tamoxifen  and will plan to switch to AI therapy - unclear menopausal status, will check FSH/estradiol once off tamoxifen  to consider if need for OFS    #Bone Health: DXA prior to AI  #Pain or Palliative/Supportive care/Survivorship: Denies pain, states good support  #Access: None  #Clinical trials: Not a candidate currently for any open trials here. Could be a future trial candidate.  FOLLOW-UP: Pt to f/u with Dr. Elodie, will f/u after surgery or pending plan   Attending Attestation:    I personally performed the service. (TP)  I spent 80 minutes with Sheila [name] and this includes face to face time and non-face to face time preparing to see the patient (eg, review of tests), obtaining and/or reviewing separately obtained history, independently interpreting results (not separately reported), communicating results to the patient/family/caregiver, and Care coordination (not separately reported). More than 50% of time was spent in face to face care of patient.  All questions were answered to the best of my ability.    Adolphus Pitter, MD  Breast Oncology, [facility]   This note has been created using automated tools and reviewed for accuracy by [facility].

## 2024-05-10 ENCOUNTER — Telehealth: Payer: Self-pay | Admitting: *Deleted

## 2024-05-10 NOTE — Telephone Encounter (Signed)
 Left message for a return phone call to follow up regarding her plans for care and whether she will have all tx at [facility] or just sx. Left contact info

## 2024-05-19 ENCOUNTER — Encounter: Payer: Self-pay | Admitting: *Deleted

## 2024-05-19 ENCOUNTER — Telehealth: Payer: Self-pay | Admitting: *Deleted

## 2024-05-19 NOTE — Telephone Encounter (Signed)
 Was able to reach patient to discuss her plans after surgery. She will be having sx at [facility] 9/22. Patient states she plans to see D.r Odean after sx.  Message sent to scheduling for an appt post op. Patient verbalized understanding.

## 2024-06-02 ENCOUNTER — Encounter: Payer: Self-pay | Admitting: *Deleted

## 2024-06-08 ENCOUNTER — Inpatient Hospital Stay: Attending: Hematology and Oncology | Admitting: Hematology and Oncology

## 2024-06-08 NOTE — Assessment & Plan Note (Deleted)
 07/31/2020: left lumpectomy July 31, 2020 showed ductal carcinoma in situ, high-grade, measuring 2.0 cm, with negative margins followed by adjuvant radiation and tamoxifen   12/31/2023: 5 to 9 cm area of distortion which on biopsy came back as grade 2 IDC with DCIS and comedonecrosis ER 50% PR 0% Ki67 30% HER2 3+ positive.   05/23/2024:Left mastectomy & SLNB, setting of in breast tumor recurrence and Paget's.  Pathology = LEFT IDC, 1.8 cm, associated extensive DCIS adjuvant to prior surgery site and involving the nipple and Pagets disease of the skin. G3, ER+/PR-/HER2+, margins negative. pT1c pN0 (sn 0/1) cM0   Recommendation: Adjuvant Taxol Herceptin Herceptin maintenance for 1 year Patient has an appointment with Dr. Ronalee to discuss adjuvant treatment options Followed by adjuvant antiestrogen therapy

## 2024-06-09 ENCOUNTER — Telehealth: Payer: Self-pay | Admitting: *Deleted

## 2024-06-09 NOTE — Telephone Encounter (Signed)
 Left voicemail for patient regarding missed appt on 10/8.  Request for her to call back to let us  know if she will be staying at [facility] for tx as I see she has an appt on 10/10 with medical oncologist at [facility] Dr. Ronalee.

## 2024-06-16 ENCOUNTER — Encounter: Payer: Self-pay | Admitting: *Deleted

## 2024-09-16 NOTE — Progress Notes (Signed)
 "  Breast Clinic Follow-Up Evaluation Medical Oncology  Patient Identification: Ms. 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  Oncologic History: - 2021: Noted rash over LEFT breast, mammogram c/f calcifications; biopsy with DCIS.  - 07/31/20: LEFT lumpectomy: High-grade DCIS, 2cm, ER+. Completed adjuvant radiation and started tamoxifen  20mg  daily, decreased to 10mg  --> 5mg  due to hot flashes. - July 2025: Again noted worsening rash over LEFT breast; biopsy 03/14/24 c/w Paget's disease, negative for dermal lymphatic involvement, ER- (0%) / PR- (0%) / HER2 3+ by IHC. - 04/13/24: Contrast-enhanced mammogram: area c/f calcifications and non-mass enhancement in the LEFT breast, US  axilla negative. Biopsy with invasive ductal carcinoma, 3mm on core, grade 2-3, ER+ (50%) / PR- (0%) / HER2 3+ by IHC, Ki67 30%, +DCIS (comedo, with associated microcalcifications, grade 3). - 05/23/24: LEFT mastectomy + SLNB: invasive ductal carcinoma (1.8cm), grade 3, associated extensive DCIS (adjacent to prior surgical site and involving the nipple) and Paget's disease of the skin, negative margins, 0/1 +SLN, ER- (0%) / PR- (0%) / HER2 3+ by IHC, additional findings of  fibrosis and foreign body giant cell reaction (c/w prior surgical site), skin with ulceration, granulation tissue, and Paget's disease.  - Recommended for APT regimen with weekly taxol x12 weeks with q3 week herceptin x1 year.  Interval History:   Presents for C3D1 of weekly paclitaxel and trastuzumab  She continues to have moderate discomfort at her port site, despite taking tylenol  and ibuprofen. No erythema, bleeding or drainage. No fevers. No issues with blood draws.   Labs this AM with CBC stable and unremarkable, CMP stable and unremarkable.  Accompanied today by two of her daughters. Reports feeling ok, although notes continued fatigue and diarrhea after each treatment. Diarrhea is mild and only lasts a few days. No nausea or vomiting. Her headaches have improved and are largely gone. She continues to get vaginal spotting at the beginning of each treatment that resolved after a few days. However, since her last treatment, she has noticed almost daily spotting. Very small amount of blood. Does have a GYN but has not been in awhile. Endorses hx of fibroids.  Continues to take melatonin as needed for sleep. Has mild neuropathy of fingertips and will continue with cryotherapy during treatment.   Requesting refill of MMW to have just in case- no current mouth sores.   Review of Systems:   Review of systems is otherwise unremarkable.   Other Pertinent Medical History: No other major medical history  Updated Social History: Lives in [place], [place]. 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 Enjoys shopping, swimming.  Genetic Testing: [facility] in November 2021 - VUS in MSH2  PROBLEM LIST: Patient Active Problem List  Diagnosis  Malignant neoplasm of upper-outer quadrant of left breast in female, estrogen receptor positive (CMS/HHS-HCC)   Hot flashes due to tamoxifen    Wound, breast, left, sequela   Calcification of  left breast on mammography   Recurrent breast cancer, left (CMS/HHS-HCC)   Paget's disease of nipple, left (CMS/HHS-HCC)    MEDICATIONS: Current Outpatient Medications  Medication Sig Dispense Refill   lidocaine -prilocaine (EMLA) cream Apply small amount to skin 1 hour prior to port access 30 g 2   multivitamin-Ca-iron-minerals Tab Take 1 tablet by mouth     naproxen sodium (ALEVE) 220 MG tablet Take 220 mg by mouth 2 (two) times daily as needed for Pain     ondansetron  (ZOFRAN ) 8 MG tablet Take 1 tablet (8 mg total) by mouth every 12 (twelve) hours as needed for Nausea 30 tablet 1   prochlorperazine (COMPAZINE) 10 MG tablet Take 1 tablet (10 mg total) by mouth every 6 (six) hours as needed for Nausea 30 tablet 1   acetaminophen  (TYLENOL ) 325 MG tablet Take 650 mg by mouth every 4 (four) hours as needed for Pain     betamethasone dipropionate (DIPROSONE) 0.05 % cream Apply topically 2 (two) times daily 15 g 0   duke's magic mouthwash Swish and spit 5 mLs 3 (three) times daily (Patient not taking: Reported on 09/16/2024) 200 mL 1   ibuprofen (MOTRIN) 200 MG tablet Take 200 mg by mouth every 6 (six) hours as needed for Pain     sulfamethoxazole-trimethoprim (BACTRIM DS) 800-160 mg tablet Take 1 tablet by mouth 2 (two) times daily     tamoxifen  (NOLVADEX ) 10 MG tablet Take 5 mg by mouth once daily (Patient not taking: Reported on 06/03/2024)     No current facility-administered medications for this visit.    ALLERGIES: No Known Allergies  Objective:    Physical Exam: BP 124/84 (BP Location: Right upper arm, Patient Position: Sitting, BP Cuff Size: Large Adult)   Pulse 87   Temp 36.7 C (98.1 F) (Oral)   Resp 16   Ht 162 cm (5' 3.78)   Wt 87.7 kg (193 lb 5.5 oz)   SpO2 98%   BMI 33.42 kg/m Body surface area is 1.99 meters squared.  General Appearance: Sitting up conversing pleasantly, in no acute distress. HEENT: Moist mucus membranes. Oropharynx clear without  lesions, exudates, or ulceration.  Lymph Nodes: No cervical or supraclavicular lymphadenopathy. Breast / Axillary Lymph Nodes:  Full breast exam deferred today. Cardiac: RRR, without murmurs, rubs, or gallops.  Lungs: Normal work of breathing on room air. CTAB. Abdomen/GI: Bowel sounds present. Soft, non-tender, and non-distended.  Extremities: No LE edema. Skin: No rashes or lesions.  Neurologic: Alert and oriented x 3. No focal deficits.  Labs/Imaging/Pathology:  RECENT LABS: Recent Results (from the past week)  Complete Blood Count (CBC) with Differential   Collection Time: 09/16/24  2:10 PM  Result Value Ref Range   WBC (White Blood Cell Count) 7.4 3.2 - 9.8 x109/L   Hemoglobin 11.2 (L) 11.7 - 15.5 g/dL   Hematocrit 64.6 64.9 - 45.0 %   Platelets 305 150 - 450 x109/L   MCV (Mean Corpuscular Volume) 89 80 - 98 fL   MCH (Mean Corpuscular Hemoglobin) 28.2 26.5 - 34.0 pg   MCHC (Mean Corpuscular Hemoglobin Concentration) 31.7 31.0 - 36.0 %   RBC (Red Blood Cell Count) 3.97 3.77 - 5.16 x1012/L   RDW-CV (Red Cell Distribution Width) 14.9 (H) 11.5 - 14.5 %   NRBC (Nucleated Red Blood Cell  Count) 0.02 (H) 0 x109/L   NRBC % (Nucleated Red Blood Cell %) 0.3 %   MPV (Mean Platelet Volume) 10.2 7.2 - 11.7 fL   Neutrophil Count 4.2 2.0 - 8.6 x109/L   Neutrophil % 56.5 37 - 80 %   Lymphocyte Count 2.2 0.6 - 4.2 x109/L   Lymphocyte % 30.1 10 - 50 %   Monocyte Count 0.5 0 - 0.9 x109/L   Monocyte % 6.5 0 - 12 %   Eosinophil Count 0.39 0 - 0.70 x109/L   Eosinophil % 5.3 0 - 7 %   Basophil Count 0.04 0 - 0.20 x109/L   Basophil % 0.5 0 - 2 %   Immature Granulocyte Count 0.08 (H) <=0.06 x109/L   Immature Granulocyte % 1.1 (H) <=0.7 %  Comprehensive Metabolic Panel (CMP)   Collection Time: 09/16/24  2:10 PM  Result Value Ref Range   Sodium 141 136 - 145 mmol/L   Potassium 4.0 3.5 - 5.0 mmol/L   Chloride 107 98 - 107 mmol/L   Carbon Dioxide (CO2) 29 21 - 31 mmol/L   Urea  Nitrogen (BUN) 18 6 - 20 mg/dL   Creatinine 0.7 0.6 - 1.2 mg/dL   Glucose 87 70 - 859 mg/dL   Calcium 9.6 8.6 - 89.6 mg/dL   AST (Aspartate Aminotransferase) 21 13 - 39 U/L   ALT (Alanine Aminotransferase) 26 7 - 52 U/L   Bilirubin, Total 0.4 0.3 - 1.0 mg/dL   Alk Phos (Alkaline Phosphatase) 76 34 - 104 U/L   Albumin 4.0 3.5 - 5.2 g/dL   Protein, Total 7.1 6.0 - 8.3 g/dL   Anion Gap 5 3 - 12 mmol/L   BUN/CREA Ratio 26 6 - 27   Glomerular Filtration Rate (eGFR)  103 mL/min/1.73sq m  HCG Urine Qualitative, Pregnancy Test   Collection Time: 09/16/24  2:10 PM  Result Value Ref Range   Human Chorionic Gonadotropin (HCG) Preg Test, Urine Qual Negative Negative    PERTINENT IMAGING: TTE 07/06/24: CONCLUSION ------------------------------------------------------------------------------- NORMAL LEFT VENTRICULAR SYSTOLIC FUNCTION WITH NO LVH ESTIMATED EF: 55%, CALC EF(3D): 56% NORMAL LA PRESSURES WITH NORMAL DIASTOLIC FUNCTION NORMAL RIGHT VENTRICULAR SYSTOLIC FUNCTION VALVULAR REGURGITATION: No AR, TRIVIAL MR, TRIVIAL PR, No TR                              NO VALVULAR STENOSIS                                                                        LV GLS -15.7% ------------------------ LV FUNCTION AND LV STRAIN LOWER LIMITS OF NORMAL                                             NO PRIOR ECHO FOR COMPARISON                                                   RECENT PATHOLOGY (PAST  90 DAYS): Results     Procedure Component Value Units Date/Time   Pathology - General / Other [[id number]] Collected: 05/23/24 1516   Specimen: Tissue-Pathology from Breast, Left; Tissue-Pathology from Lymph Node, Sentinel Updated: 05/31/24 1610    Case Report --    Surgical Pathology                                Case: [id number]                                Authorizing Provider:  Sharyl Maiden, Marjorie     Collected:           05/23/2024 1516                                    Joy, DO                                                                      [facility]     Received:            05/24/2024 0906                                    [facility]                                                                      Pathologist:           Baird Lauraine Beam, MD                                                    Specimens:   A) - Breast, Left, left breast, short stitch superior, long stitch lateral                         B) - Lymph Node, Sentinel, left axillary sentinel lymph nodes                             DIAGNOSIS --    A. Left, breast, simple mastectomy:  Invasive ductal carcinoma (18 mm) and associated extensive ductal carcinoma in-situ (adjacent to prior surgical site and involving the nipple) and Paget disease of the skin.   Surgical resection margins: Negative for malignancy.  Closest margin for IDC: anterior/inferior; distance to margin: greater than 10 mm Closest margin for Paget disease: 12:00 skin; distance to margin: greater than 2 mm  Additional findings: Biopsy site changes, 1  Fibrosis and foreign  body giant cell reaction, consistent with prior surgical site Skin with ulceration, granulation tissue, and Paget disease  See Synoptic Report.    Breast cancer biomarker studies: Not performed.    B.  Sentinel lymph node, Left, axillary, biopsy:  One lymph node, negative for malignancy (0/1).         Synoptic Report --    INVASIVE CARCINOMA OF THE BREAST: Resection INVASIVE CARCINOMA OF THE BREAST: RESECTION - All Specimens 8th Edition - Protocol posted: 02/18/2023  SPECIMEN    Procedure:    Total mastectomy    Specimen Laterality:    Left  TUMOR    Histologic Type:    Invasive carcinoma of no special type (ductal)    Histologic Grade (Nottingham Histologic Score):          Glandular (Acinar) / Tubular Differentiation:    Score 3      Nuclear Pleomorphism:    Score 3      Mitotic Rate:    Score 3      Overall Grade:    Grade 3  (scores of 8 or 9)    Tumor Size:    Greatest dimension of largest invasive focus (Millimeters): 18 mm    Tumor Focality:    Single focus of invasive carcinoma    Ductal Carcinoma In Situ (DCIS):    Present    Tumor Extent:          Tumor Extent:    DCIS involves nipple epidermis (Paget disease of the nipple)    Lymphatic and / or Vascular Invasion:    Not identified    Treatment Effect in the Breast:    No known presurgical therapy  MARGINS    Margin Status for Invasive Carcinoma:    All margins negative for invasive carcinoma      Distance from Invasive Carcinoma to Closest Margin:    Greater than: 10 mm    Margin Status for DCIS:    All margins negative for DCIS      Distance from DCIS to Closest Margin:    Greater than: 2 mm  REGIONAL LYMPH NODES    Regional Lymph Node Status:          :    All regional lymph nodes negative for tumor      Total Number of Lymph Nodes Examined (sentinel and non-sentinel):    1      Number of Sentinel Nodes Examined:    1  pTNM CLASSIFICATION (AJCC 8th Edition)    Reporting of pT, pN, and (when applicable) pM categories is based on information available to the pathologist at the time the report is issued. As per the AJCC (Chapter 1, 8th Ed.) it is the managing physician's responsibility to establish the final pathologic stage based upon all pertinent information, including but potentially not limited to this pathology report.    pT Category:    pT1c    pN Category:    pN0    N Suffix:    (sn)     Clinical Information --    Malignant neoplasm of upper-outer quadrant of left breast in female, estrogen receptor positive (CMS/HHS-HCC)     Gross Examination --    A.  Left breast.  Received fresh and placed in formalin on 05/24/2024 at 0740.  Procedure: Simple mastectomy Specimen orientation: Short stitch is superior and long stitch is lateral Specimen weight: 1175 g   Specimen dimensions:  Medial to Lateral: 21.0 cm  Anterior to Posterior: 7.8  cm  Superior to Inferior: 17.5 cm  Skin dimensions: 22.4 x 11.2 cm Nipple diameter: 1.6 cm; Areola diameter: 3.5 cm  Skin lesions: There is a 6.2 x 5.5 cm gray-tan, flat, well-circumscribed ulcerated lesion that involves the entirety of the nipple and the majority of the areola, and is 0.7 cm from the 12 o'clock skin margin.  The lesion demonstrates a patchy surface with areas of possible sloughing and necrosis.  There is also a 4.5 cm in length horizontal well-healed scar extending from the skin lesion toward the 3 o'clock skin margin. Skeletal muscle: Absent  Margins inked: Anterior-Superior: Blue Anterior-Inferior: Green  Posterior: Black   Sectioned: Medial to lateral in sagittal plane   Gross findings: Gross tumor size:  Tumor #1: 1.8 x 1.8 x 1.5 cm  Description of the tumor: Tumor #1: Tan-gray, solid, rubbery, and fibrous with very ill-defined, spiculated borders and a central area of friable hemorrhage with an embedded hourglass shaped biopsy clip.  Calcifications are absent.  Direct skin involvement is absent. Location of the tumor: Tumor #1: 3 o'clock Multifocal tumor: No. Closest gross surgical margin:  Tumor #1: Anterior-inferior Distance to closest gross margin:  Tumor #1: 1.5 cm   Additional gross findings: There is a 3.2 x 2.8 x 2.1 cm prior surgical cavity with a firm, spiculated, white fibrous periphery and dark yellow, dull, friable necrotic contents with multiple embedded vascular clips.  The surgical cavity is 4.0 cm from tumor #1 but appears continuous with tumor #1 by multiple connecting strands of fibrous tissue with possible comedonecrosis.  The cavity is less than 0.1 cm from the posterior margin and 0.1 cm from the anterior-superior margin.  Overall greatest dimension of surgical cavity and tumor #1 is 8.0 cm.  The remaining cut surfaces are yellow, soft and lobulated to white and firm, with a fat to fibrous tissue ratio of 90:10.  Sectioned specimen  radiographed?:  No.  Axillary tail: Absent, the lateral-most tissue of the upper outer quadrant is sectioned and palpated, and no lymph node candidates are identified. Representative tissue from this case was provided to the [facility]?:  No  BLOCK SUMMARY: A1-A3 tumor #1 A1 biopsy clip site A2 closest approach to anterior-inferior margin A3 with adjacent uninvolved tissue A4-A7 full cross-section of previous surgical site and tumor #1 A4 previous surgical site with closest approach to anterior-superior margin A5-A6 previous surgical site with intervening fibrous tissue A7 intervening fibrous tissue and tumor #1 A8 previous surgical site with closest approach to posterior margin A9-A12 nipple and subareolar region with skin lesion, entirely submitted A13 skin lesion with closest approach to 12 o'clock skin margin A14 additional skin lesion A15 upper outer quadrant A16 lower outer quadrant A17 upper inner quadrant A18 lower inner quadrant A19 normal skin including prior surgical scar  Collection time: 1516 on 05/23/2024 Time out of formalin: 1415 on 05/25/2024 B.  Left axillary sentinel lymph nodes, received fresh and placed in formalin on 05/24/2024 at 0710 is a 1.6 x 1.2 x 0.9 cm aggregate of tan-yellow and lobulated fibroadipose and lymphoid tissue.  A single lymph node is identified that is 0.6 cm in greatest dimension, and is bisected and entirely submitted in block B1.  A. Deeny/Dr. Martina Door     Microscopic Examination --    Microscopic examination is performed.      Additional Documentation --    All immunohistochemistry, in situ hybridization tests and special stains performed at [facility] and reported herein were developed, validated  and their performance characteristics determined by the [facility]. During the performance of these tests, appropriate positive and negative control slides are also performed  and reviewed. All control slides and internal controls (when applicable) demonstrate the expected immunoreactive patterns and/or nucleic acid hybridization. These ancillary studies were deemed medically necessary by the requesting pathologist. They were ordered following review of the H&E and clinical history except when part of a liver/kidney protocol or where clinical history (e.g. immunocompromised, critically ill, history of malignancy) clearly indicates. Some of the tests may not be cleared or approved by the U.S. Food and Drug Administration (FDA).  The FDA has determined that such clearance or approval is not necessary.  These tests are used for clinical purposes and should not be regarded as investigational or as research.  This laboratory is certified under the Clinical Laboratory Improvement Amendments of 1988 (CLIA) as qualified to perform high complexity clinical testing.     Attestation --    All of the diagnostic evaluations on the enumerated specimens have been personally conducted by the pathologists involved in the care of this patient as indicated by the electronic signatures above.    Breast Biomarkers (IHC, FISH) [[id number]] Collected: 05/23/24 1516   Specimen: Tissue-Pathology from Breast, Left Updated: 06/06/24 1350    Case Report --    Image Cytometry                                   Case: [id number]                                Authorizing Provider:  Baird Lauraine Beam, MD   Collected:           05/23/2024 1516             [facility]     Received:            06/01/2024 1322                                    [facility]                                                                      Pathologist:           Alejos Tresea BROCKS, MD                                                          Specimen:    Breast, Left, [phone]  Tissue Type --     Left breast, simple mastectomy  containing invasive ductal carcinoma  ([id number], Block A1; Cold Ischemia Time:  16 hours, 24 minutes, Formalin Fixation Time:  30 hours, 35 minutes).     INTERPRETATION --      % of Cells Staining Intensity Score Interpretation  ER IHC 0 0 NEGATIVE  PR IHC 0 0 NEGATIVE  HER2/neu IHC 75 3+ POSITIVE       Estrogen Receptor IHC Analysis --     Estrogen Receptor IHC Quantitative Analysis by AT2:   Percentage of tumor cells staining positively by AT2: 0%  Intensity of tumor cells staining positively by AT2: 0  Benign ductal epithelium stains positively (strong) and serves as a benign positive internal control.      Progesterone Receptor IHC Analysis --     Progesterone Receptor IHC Quantitative Analysis by AT2:  Percentage of tumor cells staining positively by AT2: 0%  Intensity of tumor cells staining positively by AT2: 0  Benign ductal epithelium stains positively (strong) and serves as a benign positive internal control.      HER2/neu IHC Analysis --     Her2 Immunohistochemical Quantitative Analysis by AT2:  Percentage of tumor cells staining positively by AT2: 75%  Intensity of tumor cells staining positively by AT2: 3+  Benign ductal epithelium stains appropriately and serves as a negative internal control.     Methodology --     Pre-analytic variables:  Specimens should be immersed in formalin within 1 hour of the biopsy or resection procedure (less than 1 hour cold ischemia time).  Specimens are fixed in 10% neutral buffered formalin for at least 6 hours up to 72 hours. For Her2 negative cases outside these limits, further evaluation by HER2 FISH or repeat testing on a different specimen may be considered. For ER and PR negative cases outside these limits, and with absent internal control staining, repeat testing on a different specimen is recommended.   Estrogen/Progesterone Receptor(s) IHC:  DAKO ER/PR METHODOLOGY: The Estrogen Receptor a, clone EP1 and  Progesterone Receptor, clone 636, IVD antibodies are used in conjunction with the [employer] and are used exactly as specified in the [employer] corporation directions for use on the [employer]. Slides are analyzed with computer-assisted technology using the AT2 Doreen) and a nuclear algorithm following CAP/ASCO guidelines. The percentage of positive staining cells and the intensity of staining is given.   ANALYTE SPECIFIC REAGENT: These tests were developed and their performance characteristics determined by the [facility].  They have not been cleared or approved by the FDA. The FDA has determined that such clearance or approval is not necessary.  These tests have been validated on breast adenocarcinoma but not on decalcified tissue. This laboratory is certified under the Clinical Laboratory Improvements Amendments of 1988 (CLIA) as qualified to perform high complexity clinical testing.   All control slides are verified prior to release from the laboratory and demonstrate the expected staining or signal pattern.  HER2/neu IHC:          HER2/NEU IMMUNOHISTOCHEMISTRY: The FDA-approved Dako HercepTest PharmDx Kit was used exactly as specified in the manufacturer's directions for manual use.  Slides are analyzed with computer-assisted technology using the AT2 ([employer]) and then membrane algorithm with CAP/ASCO guidelines.  Staining is graded as follows:  0 = no staining or incomplete faint/barely perceptible staining in < or = 10% of tumor cells, 1+ = incomplete faint/barely perceptible staining in >  10% of tumor cells, 2+ = circumferential incomplete and/or weak moderate staining in >10% of tumor cells OR circumferential complete intense staining in < or = 10% of cells, and 3+ = circumferential complete intense staining in >10% of tumor cells.     Attestation --    All of the diagnostic evaluations on the enumerated specimens have been personally  conducted by the pathologists involved in the care of this patient as indicated by the electronic signatures above.    Pathology - Slide Consult [[id number]] Collected: 05/05/24   Specimen: Tissue-Pathology from Soft Tissue, Other; Tissue-Pathology from Breast Updated: 05/10/24 1535    Case Report --    Surgical Pathology Report                         Case: [id number]                                 Authorizing Provider:  Sharyl Maiden, Marjorie     Collected:           05/05/2024                                         Joy, DO                                                                     Ordering Location:     [facility]                    Received:            05/10/2024 1152             Pathologist:           Alejos Tresea BROCKS, MD                                                          Specimens:   A) - Soft Tissue, Other, [phone]                                                                B) - Breast, [phone]                                                                   DIAGNOSIS --    A.  Skin, left breast, punch biopsy.  Outside case, [id number], [employer], [place], [place].  Date of procedure 03/14/24:  Paget's disease of the skin. Negative for dermal lymphatic involvement.  Breast cancer biomarker studies (per outside report, confirmed by review of slides): Estrogen receptor:   Negative (0% staining) Progesterone receptor: Negative (0% staining) Her2/neu immunohistochemistry: Positive (score = 3+)  Note: The negative ER and PR results should be interpreted with caution given the paucity of tumor cells in the biopsy.   B.  Left breast, 3:00, needle core biopsy.  Outside case, [phone], Same as above.  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 Histologic type: Ductal Provisional Nottingham combined histologic grade: 2-3 of 3 Tubule formation score: 3 Nuclear pleomorphism score: 3 Mitotic rate score: Too small for full  10 field mitotic count.  In-situ carcinoma: Present, with associated microcalcifications. Type of in-situ carcinoma: Comedo Nuclear grade of in-situ carcinoma: 3 of 3  Breast cancer biomarker studies (per outside report, slides not submitted for review): Estrogen receptor:     Positive (50% staining, weak intensity) Progesterone receptor:    Negative (0% staining) Her2/neu immunohistochemistry: Positive (score = 3+) Ki-67 proliferation index:    30%     Clinical Information --    [facility] review of outside slides is requested. History of left breast DCIS in 2021, treated with radiation and tamoxifen .      Gross Examination --    Outside case A:              [id number] Date of surgery:              03/14/24 Number of stained slides:             14 Number of blocks:                        0 Number of unstained slides:         0  Outside path report received?    Yes Material to be returned?             Yes Outside case B:              [id number] Date of surgery:              04/13/24 Number of stained slides:             5 Number of blocks:                        0 Number of unstained slides:         0  Outside path report received?    Yes Material to be returned?             Yes  Received from:   [facility]  [address]  [place], [place] [address]  T:  [phone]  F:  [phone]     Microscopic Examination --    Microscopic examination is performed.      Additional Documentation --    All immunohistochemistry, in situ hybridization tests and special stains performed at [facility] and reported herein were developed, validated and their performance characteristics determined by the [facility]. During the performance of these tests, appropriate positive and negative control slides are also performed and reviewed. All control slides and internal controls (when applicable) demonstrate the expected immunoreactive  patterns and/or nucleic acid hybridization. These ancillary studies were deemed medically necessary by the requesting pathologist. They were ordered following review of  the H&E and clinical history except when part of a liver/kidney protocol or where clinical history (e.g. immunocompromised, critically ill, history of malignancy) clearly indicates. Some of the tests may not be cleared or approved by the U.S. Food and Drug Administration (FDA).  The FDA has determined that such clearance or approval is not necessary.  These tests are used for clinical purposes and should not be regarded as investigational or as research.  This laboratory is certified under the Clinical Laboratory Improvement Amendments of 1988 (CLIA) as qualified to perform high complexity clinical testing.     Attestation --    All of the diagnostic evaluations on the enumerated specimens have been personally conducted by the pathologists involved in the care of this patient as indicated by the electronic signatures above.    Pathology - Slide Consult [[id number]] Collected: 04/27/24   Specimen: Tissue-Pathology from Breast, Left Updated: 05/03/24 1157    Case Report --    Surgical Pathology Report                         Case: [id number]                                 Authorizing Provider:  Sharyl Maiden, Marjorie     Collected:           04/27/2024                                         Joy, DO                                                                     Ordering Location:     [facility]                    Received:            04/29/2024 1059             Pathologist:           Baird Lauraine Beam, MD                                                    Specimen:    Breast, Left, [id number]                                                                 DIAGNOSIS --    A.  Breast, left, partial mastectomy, Outside case, [phone], [facility], [place], [place].  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 Type of in-situ carcinoma: comedo, solid, cribriform. Nuclear grade of in-situ carcinoma: 3. Necrosis: present  Microcalcifications: present  in association with DCIS  Surgical resection margins are negative for DCIS.  Closest margin: anterior (distance to closest margin: less than 1 mm)  Additional findings:  Biopsy site changes   Breast cancer biomarker studies: Not performed.          Clinical Information --    [facility] review of outside slides is requested.      Gross Examination --    Outside case A:              [id number] Date of surgery:              07/31/20 Number of stained slides:             16 Number of blocks:                        0 Number of unstained slides:         0  Outside path report received?    Yes Material to be returned?             Yes  Received from:   [facility] Pathology Department [address] [place], [place]  [address] Tel: [phone] Fax: [phone]     Microscopic Examination --    Microscopic examination is performed.      Additional Documentation --    All immunohistochemistry, in situ hybridization tests and special stains performed at [facility] and reported herein were developed, validated and their performance characteristics determined by the [facility]. During the performance of these tests, appropriate positive and negative control slides are also performed and reviewed. All control slides and internal controls (when applicable) demonstrate the expected immunoreactive patterns and/or nucleic acid hybridization. These ancillary studies were deemed medically necessary by the requesting pathologist. They were ordered following review of the H&E and clinical history except when part of a liver/kidney protocol or where clinical history (e.g. immunocompromised, critically ill, history of malignancy) clearly indicates. Some of the  tests may not be cleared or approved by the U.S. Food and Drug Administration (FDA).  The FDA has determined that such clearance or approval is not necessary.  These tests are used for clinical purposes and should not be regarded as investigational or as research.  This laboratory is certified under the Clinical Laboratory Improvement Amendments of 1988 (CLIA) as qualified to perform high complexity clinical testing.     Attestation --    All of the diagnostic evaluations on the enumerated specimens have been personally conducted by the pathologists involved in the care of this patient as indicated by the electronic signatures above.        Imaging, labs and pathology personally reviewed and notable for labs this AM with CBC stable and unremarkable, CMP stable and unremarkable  Assessment/Plan:  Ms. 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  Presents for follow-up and C3D1 weekly taxol + q3 week herceptin. Labs this AM with CBC stable and unremarkable, CMP stable and unremarkable. She is generally feeling fairly well. Does note continued moderate pain and discomfort at her port site so will send for a port study. Given her now persistent mild vaginal bleeding/spotting,  discussed GYN follow up. She will contact her GYN and will let us  know if any issues.   We will proceed with C3D1 weekly taxol + q3 week herceptin.   Summary of plan by problem:  #Invasive breast cancer -- Proceed with C3D1 weekly taxol + q3 week herceptin today. -- Will consider adjuvant endocrine therapy upon completion of adjuvant chemotherapy. Of note, initial skin biopsy and surg path ER- (0%) / PR- (0%), but breast biopsy ER+ (50%) / PR- (0%) - so magnitude of benefit of adjuvant endocrine therapy is uncertain but likely modest. Will re-visit this discussion toward completion of adjuvant chemotherapy. If proceeding, would favor aromatase inhibitor given prior tamoxifen  (checked FSH/estradiol levels today to consider need for OFS - these are in post-menopausal range so no need for OFS with aromatase inhibitor). -- Saw Rad Onc 09/02/2024 who do not recommend radiation at this time.  --Given her now more persistent vaginal bleeding/spotting, instructed pt to follow up with her GYN. She will let us  know if any issues scheduling- can place referral if needed.  --Will send for port study given continued discomfort at port site   #Bone health -- If decision is made to proceed with adjuvant aromatase inhibitor, will obtain baseline DEXA scan.  #Cardiovascular monitoring -- Baseline TTE 07/06/24 with LVEF 55%, LV GLS -15.7% - continue to monitor q3 months while on HER2-directed therapy. Will consider referral to Cardio Onc if any decline in LVEF or LV GLS (borderline at baseline). -- Did not discuss today- but will need repeat ECHO 10/2024  #Headaches --Improved/resolved today.  --Will continue to monitor  FOLLOW-UP: Per treatment plan - weekly paclitaxel with provider visit at start of each cycle     Attestation:  I personally performed the service. (TP)  I spent a total of 35 minutes in both face-to-face and non-face-to-face activities, excluding procedures performed, for this visit on the  date of service. All questions were answered to the best of my ability.   Manuelita Lesser, PA-C Breast Oncology [facility] "

## 2024-09-23 NOTE — Progress Notes (Signed)
 C3 D8 of paclitaxel   Nursing Assessment Neurological- alert and oriented x4 Cardiopulmonary- no issues Genitourinary- no issues Gastrointestinal- no issues; expects mild diarrhea following treatment but not severe enough to take imodium  Skin- no new rashes, wounds, or areas of concern Psychosocial- no issues Nutritional- no issues Fatigue level- 6/10 Oral mucositis- 1/5, provider aware, prescription mouthwash ordered  Pain score- 0/10  Cryotherapy administered on both hands and feet.   See flowsheet for falls assessment, IV assessment, and vital signs.   Access - Port PTA - Patient arrived accessed. Dressing and site clean, dry and intact. Blood return noted prior to and post infusion. Patient de-accessed per protocol  Patient has no additional therapy plan Patient has no orders for later Patient has no upcoming filgrastim shot  Patient tolerated treatment well and was discharged from treatment room.

## 2024-09-29 ENCOUNTER — Inpatient Hospital Stay: Payer: 59 | Admitting: Hematology and Oncology

## 2024-09-29 NOTE — Assessment & Plan Note (Addendum)
 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
